# Patient Record
Sex: Female | Born: 1950 | Race: Black or African American | Hispanic: No | Smoking: Current every day smoker
Health system: Southern US, Community
[De-identification: ages and names within clinical notes are randomized; demographics above are authoritative.]

## PROBLEM LIST (undated history)

## (undated) DIAGNOSIS — M1990 Unspecified osteoarthritis, unspecified site: Secondary | ICD-10-CM

## (undated) DIAGNOSIS — H269 Unspecified cataract: Secondary | ICD-10-CM

## (undated) DIAGNOSIS — R011 Cardiac murmur, unspecified: Secondary | ICD-10-CM

## (undated) DIAGNOSIS — T7840XA Allergy, unspecified, initial encounter: Secondary | ICD-10-CM

## (undated) DIAGNOSIS — H409 Unspecified glaucoma: Secondary | ICD-10-CM

## (undated) DIAGNOSIS — F419 Anxiety disorder, unspecified: Secondary | ICD-10-CM

## (undated) DIAGNOSIS — I10 Essential (primary) hypertension: Secondary | ICD-10-CM

## (undated) HISTORY — DX: Unspecified osteoarthritis, unspecified site: M19.90

## (undated) HISTORY — DX: Unspecified cataract: H26.9

## (undated) HISTORY — PX: POLYPECTOMY: SHX149

## (undated) HISTORY — DX: Essential (primary) hypertension: I10

## (undated) HISTORY — DX: Cardiac murmur, unspecified: R01.1

## (undated) HISTORY — DX: Anxiety disorder, unspecified: F41.9

## (undated) HISTORY — DX: Unspecified glaucoma: H40.9

## (undated) HISTORY — DX: Allergy, unspecified, initial encounter: T78.40XA

## (undated) HISTORY — PX: OTHER SURGICAL HISTORY: SHX169

## (undated) HISTORY — PX: COLONOSCOPY: SHX174

---

## 1997-08-02 ENCOUNTER — Encounter: Admission: RE | Admit: 1997-08-02 | Discharge: 1997-08-02 | Payer: Self-pay | Admitting: Family Medicine

## 1998-09-05 ENCOUNTER — Other Ambulatory Visit: Admission: RE | Admit: 1998-09-05 | Discharge: 1998-09-05 | Payer: Self-pay | Admitting: Obstetrics and Gynecology

## 1999-09-03 ENCOUNTER — Encounter: Admission: RE | Admit: 1999-09-03 | Discharge: 1999-09-03 | Payer: Self-pay | Admitting: Obstetrics and Gynecology

## 1999-09-03 ENCOUNTER — Encounter: Payer: Self-pay | Admitting: Obstetrics and Gynecology

## 2000-02-04 ENCOUNTER — Other Ambulatory Visit: Admission: RE | Admit: 2000-02-04 | Discharge: 2000-02-04 | Payer: Self-pay | Admitting: Obstetrics and Gynecology

## 2000-09-07 ENCOUNTER — Encounter: Payer: Self-pay | Admitting: Obstetrics and Gynecology

## 2000-09-07 ENCOUNTER — Encounter: Admission: RE | Admit: 2000-09-07 | Discharge: 2000-09-07 | Payer: Self-pay | Admitting: Obstetrics and Gynecology

## 2001-09-05 ENCOUNTER — Encounter: Payer: Self-pay | Admitting: Emergency Medicine

## 2001-09-05 ENCOUNTER — Emergency Department (HOSPITAL_COMMUNITY): Admission: EM | Admit: 2001-09-05 | Discharge: 2001-09-05 | Payer: Self-pay | Admitting: Emergency Medicine

## 2001-09-10 ENCOUNTER — Encounter: Admission: RE | Admit: 2001-09-10 | Discharge: 2001-09-10 | Payer: Self-pay | Admitting: Obstetrics and Gynecology

## 2001-09-10 ENCOUNTER — Encounter: Payer: Self-pay | Admitting: Obstetrics and Gynecology

## 2002-04-19 ENCOUNTER — Other Ambulatory Visit: Admission: RE | Admit: 2002-04-19 | Discharge: 2002-04-19 | Payer: Self-pay | Admitting: Obstetrics & Gynecology

## 2002-12-21 ENCOUNTER — Encounter: Admission: RE | Admit: 2002-12-21 | Discharge: 2002-12-21 | Payer: Self-pay | Admitting: Obstetrics and Gynecology

## 2002-12-21 ENCOUNTER — Encounter: Payer: Self-pay | Admitting: Obstetrics and Gynecology

## 2003-04-24 ENCOUNTER — Other Ambulatory Visit: Admission: RE | Admit: 2003-04-24 | Discharge: 2003-04-24 | Payer: Self-pay | Admitting: Obstetrics and Gynecology

## 2004-01-24 ENCOUNTER — Encounter: Admission: RE | Admit: 2004-01-24 | Discharge: 2004-01-24 | Payer: Self-pay | Admitting: Obstetrics and Gynecology

## 2005-02-17 ENCOUNTER — Encounter: Admission: RE | Admit: 2005-02-17 | Discharge: 2005-02-17 | Payer: Self-pay | Admitting: Obstetrics and Gynecology

## 2006-02-20 ENCOUNTER — Encounter: Admission: RE | Admit: 2006-02-20 | Discharge: 2006-02-20 | Payer: Self-pay | Admitting: Obstetrics and Gynecology

## 2007-02-24 ENCOUNTER — Encounter: Admission: RE | Admit: 2007-02-24 | Discharge: 2007-02-24 | Payer: Self-pay | Admitting: Internal Medicine

## 2007-07-26 ENCOUNTER — Ambulatory Visit (HOSPITAL_COMMUNITY): Admission: RE | Admit: 2007-07-26 | Discharge: 2007-07-26 | Payer: Self-pay | Admitting: Urology

## 2007-08-24 ENCOUNTER — Encounter (INDEPENDENT_AMBULATORY_CARE_PROVIDER_SITE_OTHER): Payer: Self-pay | Admitting: Urology

## 2007-08-24 ENCOUNTER — Ambulatory Visit (HOSPITAL_BASED_OUTPATIENT_CLINIC_OR_DEPARTMENT_OTHER): Admission: RE | Admit: 2007-08-24 | Discharge: 2007-08-25 | Payer: Self-pay | Admitting: Urology

## 2008-02-23 ENCOUNTER — Encounter: Admission: RE | Admit: 2008-02-23 | Discharge: 2008-02-23 | Payer: Self-pay | Admitting: Internal Medicine

## 2008-04-14 HISTORY — PX: OTHER SURGICAL HISTORY: SHX169

## 2009-02-22 ENCOUNTER — Encounter: Admission: RE | Admit: 2009-02-22 | Discharge: 2009-02-22 | Payer: Self-pay | Admitting: Internal Medicine

## 2010-03-05 ENCOUNTER — Encounter: Admission: RE | Admit: 2010-03-05 | Discharge: 2010-03-05 | Payer: Self-pay | Admitting: Obstetrics and Gynecology

## 2010-05-04 ENCOUNTER — Encounter: Payer: Self-pay | Admitting: Obstetrics and Gynecology

## 2010-07-10 ENCOUNTER — Other Ambulatory Visit: Payer: Self-pay | Admitting: Orthopedic Surgery

## 2010-07-10 DIAGNOSIS — M79644 Pain in right finger(s): Secondary | ICD-10-CM

## 2010-07-11 ENCOUNTER — Ambulatory Visit
Admission: RE | Admit: 2010-07-11 | Discharge: 2010-07-11 | Disposition: A | Discharge status: Home / Self Care | Payer: 59 | Source: Ambulatory Visit | Attending: Orthopedic Surgery | Admitting: Orthopedic Surgery

## 2010-07-11 DIAGNOSIS — M79644 Pain in right finger(s): Secondary | ICD-10-CM

## 2010-07-11 MED ORDER — GADOBENATE DIMEGLUMINE 529 MG/ML IV SOLN
6.0000 mL | Freq: Once | INTRAVENOUS | Status: AC | PRN
  Administered 2010-07-11: 6 mL via INTRAVENOUS

## 2010-08-27 NOTE — Op Note (Signed)
NAME:  Maria Andrews, Maria Andrews                 ACCOUNT NO.:  [id number]   MEDICAL RECORD NO.:  [id number]          PATIENT TYPE:  AMB   LOCATION:  [facility]                         FACILITY:  [facility]   PHYSICIAN:  Lindaann Slough, M.D.  DATE OF BIRTH:  Dec 05, 1950   DATE OF PROCEDURE:  08/24/2007  DATE OF DISCHARGE:                               OPERATIVE REPORT   REFERRING PHYSICIAN:  Maxie Better, M.D.   PREOPERATIVE DIAGNOSIS:  Urethral diverticulum.   POSTOPERATIVE DIAGNOSIS:  Urethral diverticulum.   PROCEDURE:  Urethral diverticulectomy.   SURGEON:  Danae Chen, M.D.   ANESTHESIA:  General.   INDICATIONS:  The patient is a 60 year old female who was found on  physical examination by Dr. Cherly Hensen to have a malodorous drainage from  the urethra.  VCUG showed a urethral diverticulum.  Cystoscopy showed an  ostium on the floor of the urethra to the right side of the midline.  The patient is now admitted for urethral diverticulectomy.  The  procedure, risk and benefits were discussed with the patient.  The risks  include, but are not limited to, hemorrhage, infection, injury to  adjacent organs with subsequent fistula.  She understands and is  agreeable.   DESCRIPTION OF PROCEDURE:  Under general anesthesia, the patient was  prepped and draped and placed in the dorsal lithotomy position.  The  labia majora were approximated to the inner thigh with #2-0 silk.  The  vaginal mucosa was then infiltrated with 1% lidocaine with epinephrine.  Then the vaginal speculum was placed in the vagina.  A panendoscope was  then inserted in the bladder.  The bladder mucosa is normal.  There is  no stone or tumor in the bladder.  The ureteral orifices are in normal  position and shape with clear efflux.  There is a diverticulum on the  right side of the urethra and the ostium of the diverticulum is on the  floor of the urethra to the right of the midline.  The cystoscope was  then removed.  A #18 Foley  catheter was then inserted in the bladder.  A  longitudinal incision was made on the vaginal mucosa in the midline at  about 2 cm from the urethral meatus.  The incision was carried down to  the subcutaneous tissues.  Then a transverse incision was made in the  periurethral fascia and the diverticulum was identified and dissected  from the periurethral tissues.  Then the diverticulum was excised.  The  ostium of the diverticulum was identified and then closed in two layers  in a longitudinal fashion with a #3-0 Vicryl.  The bladder was then  filled with normal saline and there was no evidence of leak from the  bladder.  On pulling the Foley catheter in the urethra, the closure was  watertight.  The Foley catheter was then replaced in the bladder.  The  wound was then irrigated with bug juice.  The Foley catheter was then  removed.  The cystoscope was then reinserted in the bladder.  There was  no evidence of injury to the bladder and  the ostium of the diverticulum  was closed.  The urethra is normal.  Then the subcutaneous tissues were  closed in a transverse fashion with #3-0 Vicryl and the vaginal mucosa  was closed in a longitudinal fashion with #2-0 Vicryl.  Please note that  a urethral dilation was done at the beginning of the procedure since the  cystoscope could not be passed in the bladder without difficulty.  The  meatus was dilated up to #30-French.  Then a vaginal packing with  Estrace cream was then placed in the vagina.  The 2-0 silk sutures were  then removed.  The vaginal speculum was removed.  The Foley catheter was  then reinserted in the bladder and left to straight drainage.   ESTIMATED BLOOD LOSS:  Minimal.   The patient tolerated the procedure well and left the OR in satisfactory  condition to post anesthesia care unit.      Lindaann Slough, M.D.  Electronically Signed     MN/MEDQ  D:  08/24/2007  T:  08/24/2007  Job:  [id number]

## 2010-10-09 ENCOUNTER — Other Ambulatory Visit: Payer: Self-pay | Admitting: Obstetrics and Gynecology

## 2011-01-28 ENCOUNTER — Other Ambulatory Visit: Payer: Self-pay | Admitting: Obstetrics and Gynecology

## 2011-01-28 DIAGNOSIS — Z1231 Encounter for screening mammogram for malignant neoplasm of breast: Secondary | ICD-10-CM

## 2011-03-07 ENCOUNTER — Ambulatory Visit
Admission: RE | Admit: 2011-03-07 | Discharge: 2011-03-07 | Disposition: A | Discharge status: Home / Self Care | Payer: 59 | Source: Ambulatory Visit | Attending: Obstetrics and Gynecology | Admitting: Obstetrics and Gynecology

## 2011-03-07 DIAGNOSIS — Z1231 Encounter for screening mammogram for malignant neoplasm of breast: Secondary | ICD-10-CM

## 2012-01-27 ENCOUNTER — Other Ambulatory Visit: Payer: Self-pay | Admitting: Obstetrics and Gynecology

## 2012-01-27 DIAGNOSIS — Z1231 Encounter for screening mammogram for malignant neoplasm of breast: Secondary | ICD-10-CM

## 2012-03-08 ENCOUNTER — Ambulatory Visit
Admission: RE | Admit: 2012-03-08 | Discharge: 2012-03-08 | Disposition: A | Discharge status: Home / Self Care | Payer: 59 | Source: Ambulatory Visit | Attending: Obstetrics and Gynecology | Admitting: Obstetrics and Gynecology

## 2012-03-08 DIAGNOSIS — Z1231 Encounter for screening mammogram for malignant neoplasm of breast: Secondary | ICD-10-CM

## 2012-03-12 ENCOUNTER — Ambulatory Visit: Payer: 59

## 2012-04-14 HISTORY — PX: FOOT SURGERY: SHX648

## 2013-02-04 ENCOUNTER — Other Ambulatory Visit: Payer: Self-pay

## 2013-02-04 DIAGNOSIS — Z1231 Encounter for screening mammogram for malignant neoplasm of breast: Secondary | ICD-10-CM

## 2013-02-10 ENCOUNTER — Ambulatory Visit: Payer: Self-pay | Admitting: Podiatry

## 2013-02-21 ENCOUNTER — Ambulatory Visit (INDEPENDENT_AMBULATORY_CARE_PROVIDER_SITE_OTHER): Payer: 59

## 2013-02-21 ENCOUNTER — Encounter: Payer: Self-pay | Admitting: Podiatry

## 2013-02-21 ENCOUNTER — Ambulatory Visit (INDEPENDENT_AMBULATORY_CARE_PROVIDER_SITE_OTHER): Payer: 59 | Admitting: Podiatry

## 2013-02-21 VITALS — BP 176/93 | HR 77 | Resp 16

## 2013-02-21 DIAGNOSIS — M79609 Pain in unspecified limb: Secondary | ICD-10-CM

## 2013-02-21 DIAGNOSIS — M79672 Pain in left foot: Secondary | ICD-10-CM

## 2013-02-21 DIAGNOSIS — M779 Enthesopathy, unspecified: Secondary | ICD-10-CM

## 2013-02-21 DIAGNOSIS — M2010 Hallux valgus (acquired), unspecified foot: Secondary | ICD-10-CM

## 2013-02-21 MED ORDER — TRIAMCINOLONE ACETONIDE 10 MG/ML IJ SUSP
5.0000 mg | Freq: Once | INTRAMUSCULAR | Status: AC
  Administered 2013-02-21: 5 mg via INTRA_ARTICULAR

## 2013-02-21 NOTE — Progress Notes (Signed)
Subjective:     Patient ID: Maria Andrews, female   DOB: Aug 23, 1950, 62 y.o.   MRN: [id number]  Foot Pain   patient states that my left foot is hurting in the ball and that both feet hurt when I get up in the morning. States this is been going on for several months   Review of Systems  All other systems reviewed and are negative.       Objective:   Physical Exam  Constitutional: She is oriented to person, place, and time.  Cardiovascular: Intact distal pulses.   Musculoskeletal: Normal range of motion.  Neurological: She is oriented to person, place, and time.  Skin: Skin is warm.   patient's left second MPJ is sore at the current time with mild discomfort underneath the adjacent metatarsal    Assessment:     Probable capsulitis second MPJ left    Plan:     X-ray taken and discussed and discussed working on this condition. Did a proximal nerve block I then aspirated the joint was able to get a small amount of fluid and injected with a half cc of dexamethasone Kenalog combination and applied thick plantar pad reappoint her recheck in several weeks

## 2013-02-21 NOTE — Patient Instructions (Signed)
Wear the pad during the day and take off when sitting, showering or sleeping

## 2013-03-09 ENCOUNTER — Ambulatory Visit: Payer: 59

## 2013-03-14 ENCOUNTER — Ambulatory Visit: Payer: 59 | Admitting: Podiatry

## 2013-04-21 ENCOUNTER — Ambulatory Visit
Admission: RE | Admit: 2013-04-21 | Discharge: 2013-04-21 | Disposition: A | Discharge status: Home / Self Care | Payer: 59 | Source: Ambulatory Visit

## 2013-04-21 DIAGNOSIS — Z1231 Encounter for screening mammogram for malignant neoplasm of breast: Secondary | ICD-10-CM

## 2013-09-12 ENCOUNTER — Encounter: Payer: Self-pay | Admitting: Internal Medicine

## 2013-09-19 ENCOUNTER — Encounter: Payer: Self-pay | Admitting: Internal Medicine

## 2013-11-25 ENCOUNTER — Ambulatory Visit (AMBULATORY_SURGERY_CENTER): Payer: Self-pay

## 2013-11-25 VITALS — Ht 69.0 in | Wt 165.2 lb

## 2013-11-25 DIAGNOSIS — Z1211 Encounter for screening for malignant neoplasm of colon: Secondary | ICD-10-CM

## 2013-11-25 MED ORDER — MOVIPREP 100 G PO SOLR: ORAL | Status: DC

## 2013-12-09 ENCOUNTER — Other Ambulatory Visit: Payer: Self-pay | Admitting: Internal Medicine

## 2013-12-09 ENCOUNTER — Encounter: Payer: Self-pay | Admitting: Internal Medicine

## 2013-12-09 ENCOUNTER — Ambulatory Visit (AMBULATORY_SURGERY_CENTER): Payer: 59 | Admitting: Internal Medicine

## 2013-12-09 VITALS — BP 166/92 | HR 64 | Temp 98.5°F | Resp 11 | Ht 69.0 in | Wt 165.0 lb

## 2013-12-09 DIAGNOSIS — Z1211 Encounter for screening for malignant neoplasm of colon: Secondary | ICD-10-CM

## 2013-12-09 DIAGNOSIS — D122 Benign neoplasm of ascending colon: Secondary | ICD-10-CM

## 2013-12-09 DIAGNOSIS — D126 Benign neoplasm of colon, unspecified: Secondary | ICD-10-CM

## 2013-12-09 MED ORDER — SODIUM CHLORIDE 0.9 % IV SOLN: 500.0000 mL | INTRAVENOUS | Status: DC

## 2013-12-09 NOTE — Progress Notes (Signed)
Called to room to assist during endoscopic procedure.  Patient ID and intended procedure confirmed with present staff. Received instructions for my participation in the procedure from the performing physician.  

## 2013-12-09 NOTE — Op Note (Signed)
[place]  [address] [place], [address]   COLONOSCOPY PROCEDURE REPORT  PATIENT: Aniylah, [name]  MR#: [id number] BIRTHDATE: 1951/03/08 , 62  yrs. old GENDER: Female ENDOSCOPIST: Lafayette Dragon, MD REFERRED PR:[facility], M.D. PROCEDURE DATE:  12/09/2013 PROCEDURE:   Colonoscopy with cold biopsy polypectomy First Screening Colonoscopy - Avg.  risk and is 50 yrs.  old or older - No.  Prior Negative Screening - Now for repeat screening. 10 or more years since last screening  History of Adenoma - Now for follow-up colonoscopy & has been > or = to 3 yrs.  N/A  Polyps Removed Today? Yes. ASA CLASS:   Class I INDICATIONS:average risk for colon cancer.  The last exam in June 2005 was normal. MEDICATIONS: MAC sedation, administered by CRNA and propofol (Diprivan) 350mg  IV  DESCRIPTION OF PROCEDURE:   After the risks benefits and alternatives of the procedure were thoroughly explained, informed consent was obtained.  A digital rectal exam revealed no abnormalities of the rectum.   The LB [id number]-H190 [id number]  endoscope was introduced through the anus and advanced to the cecum, which was identified by both the appendix and ileocecal valve. No adverse events experienced.   The quality of the prep was good, using MoviPrep  The instrument was then slowly withdrawn as the colon was fully examined.      COLON FINDINGS: A diminutive sessile polyp was found in the ascending colon.  A polypectomy was performed with cold forceps. The resection was complete and the polyp tissue was completely retrieved.  Retroflexed views revealed no abnormalities. The time to cecum=5 minutes 30 seconds.  Withdrawal time=6 minutes 22 seconds.  The scope was withdrawn and the procedure completed. COMPLICATIONS: There were no complications.  ENDOSCOPIC IMPRESSION: Diminutive sessile polyp was found in the ascending colon; polypectomy was performed with cold  forceps  RECOMMENDATIONS: 1.  Await biopsy results 2.  high fiber diet Recall colonoscopy pending path report   eSigned:  Lafayette Dragon, MD 12/09/2013 10:13 AM   cc:   PATIENT NAME:  Maria, Andrews MR#: [id number]

## 2013-12-09 NOTE — Patient Instructions (Signed)
YOU HAD AN ENDOSCOPIC PROCEDURE TODAY AT THE [facility]: Refer to the procedure report that was given to you for any specific questions about what was found during the examination.  If the procedure report does not answer your questions, please call your gastroenterologist to clarify.  If you requested that your care partner not be given the details of your procedure findings, then the procedure report has been included in a sealed envelope for you to review at your convenience later.  YOU SHOULD EXPECT: Some feelings of bloating in the abdomen. Passage of more gas than usual.  Walking can help get rid of the air that was put into your GI tract during the procedure and reduce the bloating. If you had a lower endoscopy (such as a colonoscopy or flexible sigmoidoscopy) you may notice spotting of blood in your stool or on the toilet paper. If you underwent a bowel prep for your procedure, then you may not have a normal bowel movement for a few days.  DIET: Your first meal following the procedure should be a light meal and then it is ok to progress to your normal diet.  A half-sandwich or bowl of soup is an example of a good first meal.  Heavy or fried foods are harder to digest and may make you feel nauseous or bloated.  Likewise meals heavy in dairy and vegetables can cause extra gas to form and this can also increase the bloating.  Drink plenty of fluids but you should avoid alcoholic beverages for 24 hours.  ACTIVITY: Your care partner should take you home directly after the procedure.  You should plan to take it easy, moving slowly for the rest of the day.  You can resume normal activity the day after the procedure however you should NOT DRIVE or use heavy machinery for 24 hours (because of the sedation medicines used during the test).    SYMPTOMS TO REPORT IMMEDIATELY: A gastroenterologist can be reached at any hour.  During normal business hours, 8:30 AM to 5:00 PM Monday through Friday,  call [phone].  After hours and on weekends, please call the GI answering service at [phone] who will take a message and have the physician on call contact you.   Following lower endoscopy (colonoscopy or flexible sigmoidoscopy):  Excessive amounts of blood in the stool  Significant tenderness or worsening of abdominal pains  Swelling of the abdomen that is new, acute  Fever of 100F or higher    FOLLOW UP: If any biopsies were taken you will be contacted by phone or by letter within the next 1-3 weeks.  Call your gastroenterologist if you have not heard about the biopsies in 3 weeks.  Our staff will call the home number listed on your records the next business day following your procedure to check on you and address any questions or concerns that you may have at that time regarding the information given to you following your procedure. This is a courtesy call and so if there is no answer at the home number and we have not heard from you through the emergency physician on call, we will assume that you have returned to your regular daily activities without incident.  SIGNATURES/CONFIDENTIALITY: You and/or your care partner have signed paperwork which will be entered into your electronic medical record.  These signatures attest to the fact that that the information above on your After Visit Summary has been reviewed and is understood.  Full responsibility of the confidentiality   of this discharge information lies with you and/or your care-partner.   INFORMATION ON POLYPS AND HIGH FIBER DIET GIVEN TO YOU TODAY

## 2013-12-09 NOTE — Progress Notes (Signed)
Procedure ends, to recovery, report given and VSS. 

## 2013-12-12 ENCOUNTER — Telehealth: Payer: Self-pay

## 2013-12-12 NOTE — Telephone Encounter (Signed)
Left message on answering machine. 

## 2013-12-13 ENCOUNTER — Encounter: Payer: Self-pay | Admitting: Internal Medicine

## 2013-12-14 ENCOUNTER — Encounter: Payer: Self-pay | Admitting: *Deleted

## 2014-03-21 ENCOUNTER — Other Ambulatory Visit: Payer: Self-pay

## 2014-03-21 DIAGNOSIS — Z1231 Encounter for screening mammogram for malignant neoplasm of breast: Secondary | ICD-10-CM

## 2014-04-14 HISTORY — PX: KNEE ARTHROSCOPY: SHX127

## 2014-04-28 ENCOUNTER — Encounter (INDEPENDENT_AMBULATORY_CARE_PROVIDER_SITE_OTHER): Payer: Self-pay

## 2014-04-28 ENCOUNTER — Ambulatory Visit
Admission: RE | Admit: 2014-04-28 | Discharge: 2014-04-28 | Disposition: A | Discharge status: Home / Self Care | Payer: 59 | Source: Ambulatory Visit

## 2014-04-28 DIAGNOSIS — Z1231 Encounter for screening mammogram for malignant neoplasm of breast: Secondary | ICD-10-CM

## 2015-04-11 ENCOUNTER — Other Ambulatory Visit: Payer: Self-pay

## 2015-04-11 DIAGNOSIS — Z1231 Encounter for screening mammogram for malignant neoplasm of breast: Secondary | ICD-10-CM

## 2015-05-08 ENCOUNTER — Ambulatory Visit
Admission: RE | Admit: 2015-05-08 | Discharge: 2015-05-08 | Disposition: A | Discharge status: Home / Self Care | Payer: BLUE CROSS/BLUE SHIELD | Source: Ambulatory Visit

## 2015-05-08 DIAGNOSIS — Z1231 Encounter for screening mammogram for malignant neoplasm of breast: Secondary | ICD-10-CM

## 2016-03-31 ENCOUNTER — Other Ambulatory Visit: Payer: Self-pay | Admitting: Obstetrics and Gynecology

## 2016-03-31 DIAGNOSIS — Z1231 Encounter for screening mammogram for malignant neoplasm of breast: Secondary | ICD-10-CM

## 2016-05-09 ENCOUNTER — Ambulatory Visit
Admission: RE | Admit: 2016-05-09 | Discharge: 2016-05-09 | Disposition: A | Discharge status: Home / Self Care | Payer: BLUE CROSS/BLUE SHIELD | Source: Ambulatory Visit | Attending: Obstetrics and Gynecology | Admitting: Obstetrics and Gynecology

## 2016-05-09 DIAGNOSIS — Z1231 Encounter for screening mammogram for malignant neoplasm of breast: Secondary | ICD-10-CM

## 2017-03-30 ENCOUNTER — Other Ambulatory Visit: Payer: Self-pay | Admitting: Obstetrics and Gynecology

## 2017-03-30 DIAGNOSIS — Z139 Encounter for screening, unspecified: Secondary | ICD-10-CM

## 2017-05-11 ENCOUNTER — Ambulatory Visit
Admission: RE | Admit: 2017-05-11 | Discharge: 2017-05-11 | Disposition: A | Discharge status: Home / Self Care | Payer: BLUE CROSS/BLUE SHIELD | Source: Ambulatory Visit | Attending: Obstetrics and Gynecology | Admitting: Obstetrics and Gynecology

## 2017-05-11 DIAGNOSIS — Z139 Encounter for screening, unspecified: Secondary | ICD-10-CM

## 2018-03-15 DIAGNOSIS — I10 Essential (primary) hypertension: Secondary | ICD-10-CM | POA: Diagnosis not present

## 2018-03-15 DIAGNOSIS — M1711 Unilateral primary osteoarthritis, right knee: Secondary | ICD-10-CM | POA: Diagnosis not present

## 2018-03-15 DIAGNOSIS — Z79899 Other long term (current) drug therapy: Secondary | ICD-10-CM | POA: Diagnosis not present

## 2018-03-15 DIAGNOSIS — Z0000 Encounter for general adult medical examination without abnormal findings: Secondary | ICD-10-CM | POA: Diagnosis not present

## 2018-03-30 DIAGNOSIS — K529 Noninfective gastroenteritis and colitis, unspecified: Secondary | ICD-10-CM | POA: Diagnosis not present

## 2018-03-30 DIAGNOSIS — Z0000 Encounter for general adult medical examination without abnormal findings: Secondary | ICD-10-CM | POA: Diagnosis not present

## 2018-03-30 DIAGNOSIS — I10 Essential (primary) hypertension: Secondary | ICD-10-CM | POA: Diagnosis not present

## 2018-04-12 ENCOUNTER — Other Ambulatory Visit: Payer: Self-pay | Admitting: Obstetrics and Gynecology

## 2018-04-12 DIAGNOSIS — Z1231 Encounter for screening mammogram for malignant neoplasm of breast: Secondary | ICD-10-CM

## 2018-04-26 DIAGNOSIS — M1711 Unilateral primary osteoarthritis, right knee: Secondary | ICD-10-CM | POA: Diagnosis not present

## 2018-05-03 DIAGNOSIS — M21061 Valgus deformity, not elsewhere classified, right knee: Secondary | ICD-10-CM | POA: Diagnosis not present

## 2018-05-03 DIAGNOSIS — M7061 Trochanteric bursitis, right hip: Secondary | ICD-10-CM | POA: Diagnosis not present

## 2018-05-03 DIAGNOSIS — R531 Weakness: Secondary | ICD-10-CM | POA: Diagnosis not present

## 2018-05-10 DIAGNOSIS — M7061 Trochanteric bursitis, right hip: Secondary | ICD-10-CM | POA: Diagnosis not present

## 2018-05-10 DIAGNOSIS — R531 Weakness: Secondary | ICD-10-CM | POA: Diagnosis not present

## 2018-05-10 DIAGNOSIS — M21061 Valgus deformity, not elsewhere classified, right knee: Secondary | ICD-10-CM | POA: Diagnosis not present

## 2018-05-13 ENCOUNTER — Ambulatory Visit
Admission: RE | Admit: 2018-05-13 | Discharge: 2018-05-13 | Disposition: A | Discharge status: Home / Self Care | Payer: Medicare Other | Source: Ambulatory Visit | Attending: Obstetrics and Gynecology | Admitting: Obstetrics and Gynecology

## 2018-05-13 DIAGNOSIS — Z1231 Encounter for screening mammogram for malignant neoplasm of breast: Secondary | ICD-10-CM | POA: Diagnosis not present

## 2018-08-04 ENCOUNTER — Other Ambulatory Visit: Payer: Self-pay | Admitting: Obstetrics and Gynecology

## 2018-08-04 DIAGNOSIS — R5381 Other malaise: Secondary | ICD-10-CM

## 2018-11-04 ENCOUNTER — Encounter: Payer: Self-pay | Admitting: Gastroenterology

## 2018-11-23 DIAGNOSIS — H401131 Primary open-angle glaucoma, bilateral, mild stage: Secondary | ICD-10-CM | POA: Diagnosis not present

## 2018-11-23 DIAGNOSIS — H52223 Regular astigmatism, bilateral: Secondary | ICD-10-CM | POA: Diagnosis not present

## 2018-12-23 DIAGNOSIS — H401132 Primary open-angle glaucoma, bilateral, moderate stage: Secondary | ICD-10-CM | POA: Diagnosis not present

## 2019-04-05 ENCOUNTER — Other Ambulatory Visit: Payer: Self-pay | Admitting: Obstetrics and Gynecology

## 2019-04-05 DIAGNOSIS — Z1231 Encounter for screening mammogram for malignant neoplasm of breast: Secondary | ICD-10-CM

## 2019-04-26 DIAGNOSIS — Z0000 Encounter for general adult medical examination without abnormal findings: Secondary | ICD-10-CM | POA: Diagnosis not present

## 2019-04-26 DIAGNOSIS — Z23 Encounter for immunization: Secondary | ICD-10-CM | POA: Diagnosis not present

## 2019-04-26 DIAGNOSIS — R5383 Other fatigue: Secondary | ICD-10-CM | POA: Diagnosis not present

## 2019-04-26 DIAGNOSIS — M25561 Pain in right knee: Secondary | ICD-10-CM | POA: Diagnosis not present

## 2019-04-26 DIAGNOSIS — I10 Essential (primary) hypertension: Secondary | ICD-10-CM | POA: Diagnosis not present

## 2019-04-26 DIAGNOSIS — Z780 Asymptomatic menopausal state: Secondary | ICD-10-CM | POA: Diagnosis not present

## 2019-04-26 DIAGNOSIS — M1711 Unilateral primary osteoarthritis, right knee: Secondary | ICD-10-CM | POA: Diagnosis not present

## 2019-05-03 DIAGNOSIS — Z0000 Encounter for general adult medical examination without abnormal findings: Secondary | ICD-10-CM | POA: Diagnosis not present

## 2019-05-03 DIAGNOSIS — I10 Essential (primary) hypertension: Secondary | ICD-10-CM | POA: Diagnosis not present

## 2019-05-03 DIAGNOSIS — M1711 Unilateral primary osteoarthritis, right knee: Secondary | ICD-10-CM | POA: Diagnosis not present

## 2019-05-03 DIAGNOSIS — Z780 Asymptomatic menopausal state: Secondary | ICD-10-CM | POA: Diagnosis not present

## 2019-05-03 DIAGNOSIS — F17200 Nicotine dependence, unspecified, uncomplicated: Secondary | ICD-10-CM | POA: Diagnosis not present

## 2019-05-03 DIAGNOSIS — M25561 Pain in right knee: Secondary | ICD-10-CM | POA: Diagnosis not present

## 2019-05-11 DIAGNOSIS — M21061 Valgus deformity, not elsewhere classified, right knee: Secondary | ICD-10-CM | POA: Diagnosis not present

## 2019-05-11 DIAGNOSIS — M1711 Unilateral primary osteoarthritis, right knee: Secondary | ICD-10-CM | POA: Diagnosis not present

## 2019-05-23 ENCOUNTER — Ambulatory Visit: Payer: Medicare HMO

## 2019-05-23 ENCOUNTER — Other Ambulatory Visit: Payer: Self-pay

## 2019-05-23 ENCOUNTER — Ambulatory Visit
Admission: RE | Admit: 2019-05-23 | Discharge: 2019-05-23 | Disposition: A | Discharge status: Home / Self Care | Payer: Medicare Other | Source: Ambulatory Visit | Attending: Obstetrics and Gynecology | Admitting: Obstetrics and Gynecology

## 2019-05-23 DIAGNOSIS — Z1231 Encounter for screening mammogram for malignant neoplasm of breast: Secondary | ICD-10-CM | POA: Diagnosis not present

## 2019-06-30 DIAGNOSIS — H401131 Primary open-angle glaucoma, bilateral, mild stage: Secondary | ICD-10-CM | POA: Diagnosis not present

## 2019-08-08 DIAGNOSIS — Z124 Encounter for screening for malignant neoplasm of cervix: Secondary | ICD-10-CM | POA: Diagnosis not present

## 2019-08-08 DIAGNOSIS — Z01419 Encounter for gynecological examination (general) (routine) without abnormal findings: Secondary | ICD-10-CM | POA: Diagnosis not present

## 2019-08-17 DIAGNOSIS — M21061 Valgus deformity, not elsewhere classified, right knee: Secondary | ICD-10-CM | POA: Diagnosis not present

## 2019-08-17 DIAGNOSIS — M1711 Unilateral primary osteoarthritis, right knee: Secondary | ICD-10-CM | POA: Diagnosis not present

## 2019-09-14 DIAGNOSIS — M1711 Unilateral primary osteoarthritis, right knee: Secondary | ICD-10-CM | POA: Diagnosis not present

## 2019-09-21 DIAGNOSIS — M1711 Unilateral primary osteoarthritis, right knee: Secondary | ICD-10-CM | POA: Diagnosis not present

## 2019-09-28 DIAGNOSIS — M1711 Unilateral primary osteoarthritis, right knee: Secondary | ICD-10-CM | POA: Diagnosis not present

## 2019-11-16 DIAGNOSIS — M7061 Trochanteric bursitis, right hip: Secondary | ICD-10-CM | POA: Diagnosis not present

## 2019-11-16 DIAGNOSIS — M21061 Valgus deformity, not elsewhere classified, right knee: Secondary | ICD-10-CM | POA: Diagnosis not present

## 2019-12-15 DIAGNOSIS — H524 Presbyopia: Secondary | ICD-10-CM | POA: Diagnosis not present

## 2019-12-15 DIAGNOSIS — H5203 Hypermetropia, bilateral: Secondary | ICD-10-CM | POA: Diagnosis not present

## 2019-12-15 DIAGNOSIS — H401131 Primary open-angle glaucoma, bilateral, mild stage: Secondary | ICD-10-CM | POA: Diagnosis not present

## 2019-12-15 DIAGNOSIS — H25813 Combined forms of age-related cataract, bilateral: Secondary | ICD-10-CM | POA: Diagnosis not present

## 2019-12-15 DIAGNOSIS — I10 Essential (primary) hypertension: Secondary | ICD-10-CM | POA: Diagnosis not present

## 2019-12-15 DIAGNOSIS — H52223 Regular astigmatism, bilateral: Secondary | ICD-10-CM | POA: Diagnosis not present

## 2019-12-30 DIAGNOSIS — H2513 Age-related nuclear cataract, bilateral: Secondary | ICD-10-CM | POA: Diagnosis not present

## 2019-12-30 DIAGNOSIS — H401132 Primary open-angle glaucoma, bilateral, moderate stage: Secondary | ICD-10-CM | POA: Diagnosis not present

## 2019-12-30 DIAGNOSIS — H2512 Age-related nuclear cataract, left eye: Secondary | ICD-10-CM | POA: Diagnosis not present

## 2020-01-26 DIAGNOSIS — H52222 Regular astigmatism, left eye: Secondary | ICD-10-CM | POA: Diagnosis not present

## 2020-01-26 DIAGNOSIS — Z961 Presence of intraocular lens: Secondary | ICD-10-CM | POA: Diagnosis not present

## 2020-01-26 DIAGNOSIS — H401122 Primary open-angle glaucoma, left eye, moderate stage: Secondary | ICD-10-CM | POA: Diagnosis not present

## 2020-01-26 DIAGNOSIS — H2512 Age-related nuclear cataract, left eye: Secondary | ICD-10-CM | POA: Diagnosis not present

## 2020-01-27 DIAGNOSIS — H2511 Age-related nuclear cataract, right eye: Secondary | ICD-10-CM | POA: Diagnosis not present

## 2020-02-09 DIAGNOSIS — H401112 Primary open-angle glaucoma, right eye, moderate stage: Secondary | ICD-10-CM | POA: Diagnosis not present

## 2020-02-09 DIAGNOSIS — H524 Presbyopia: Secondary | ICD-10-CM | POA: Diagnosis not present

## 2020-02-09 DIAGNOSIS — H52223 Regular astigmatism, bilateral: Secondary | ICD-10-CM | POA: Diagnosis not present

## 2020-02-09 DIAGNOSIS — H5201 Hypermetropia, right eye: Secondary | ICD-10-CM | POA: Diagnosis not present

## 2020-02-09 DIAGNOSIS — Z961 Presence of intraocular lens: Secondary | ICD-10-CM | POA: Diagnosis not present

## 2020-02-09 DIAGNOSIS — H2511 Age-related nuclear cataract, right eye: Secondary | ICD-10-CM | POA: Diagnosis not present

## 2020-04-20 ENCOUNTER — Other Ambulatory Visit: Payer: Self-pay | Admitting: Obstetrics and Gynecology

## 2020-04-20 DIAGNOSIS — Z1231 Encounter for screening mammogram for malignant neoplasm of breast: Secondary | ICD-10-CM

## 2020-05-10 DIAGNOSIS — F17200 Nicotine dependence, unspecified, uncomplicated: Secondary | ICD-10-CM | POA: Diagnosis not present

## 2020-05-10 DIAGNOSIS — I10 Essential (primary) hypertension: Secondary | ICD-10-CM | POA: Diagnosis not present

## 2020-05-10 DIAGNOSIS — Z0000 Encounter for general adult medical examination without abnormal findings: Secondary | ICD-10-CM | POA: Diagnosis not present

## 2020-05-14 DIAGNOSIS — Z0100 Encounter for examination of eyes and vision without abnormal findings: Secondary | ICD-10-CM | POA: Diagnosis not present

## 2020-05-15 DIAGNOSIS — F17200 Nicotine dependence, unspecified, uncomplicated: Secondary | ICD-10-CM | POA: Diagnosis not present

## 2020-05-15 DIAGNOSIS — I10 Essential (primary) hypertension: Secondary | ICD-10-CM | POA: Diagnosis not present

## 2020-05-15 DIAGNOSIS — Z0000 Encounter for general adult medical examination without abnormal findings: Secondary | ICD-10-CM | POA: Diagnosis not present

## 2020-05-30 DIAGNOSIS — M21061 Valgus deformity, not elsewhere classified, right knee: Secondary | ICD-10-CM | POA: Diagnosis not present

## 2020-05-30 DIAGNOSIS — M545 Low back pain, unspecified: Secondary | ICD-10-CM | POA: Diagnosis not present

## 2020-05-30 DIAGNOSIS — M1711 Unilateral primary osteoarthritis, right knee: Secondary | ICD-10-CM | POA: Diagnosis not present

## 2020-05-31 ENCOUNTER — Other Ambulatory Visit: Payer: Self-pay

## 2020-05-31 ENCOUNTER — Ambulatory Visit
Admission: RE | Admit: 2020-05-31 | Discharge: 2020-05-31 | Disposition: A | Discharge status: Home / Self Care | Payer: Medicare HMO | Source: Ambulatory Visit | Attending: Obstetrics and Gynecology | Admitting: Obstetrics and Gynecology

## 2020-05-31 DIAGNOSIS — Z1231 Encounter for screening mammogram for malignant neoplasm of breast: Secondary | ICD-10-CM | POA: Diagnosis not present

## 2020-06-13 DIAGNOSIS — S39012A Strain of muscle, fascia and tendon of lower back, initial encounter: Secondary | ICD-10-CM | POA: Diagnosis not present

## 2020-06-13 DIAGNOSIS — M9902 Segmental and somatic dysfunction of thoracic region: Secondary | ICD-10-CM | POA: Diagnosis not present

## 2020-06-13 DIAGNOSIS — S338XXA Sprain of other parts of lumbar spine and pelvis, initial encounter: Secondary | ICD-10-CM | POA: Diagnosis not present

## 2020-06-13 DIAGNOSIS — M9905 Segmental and somatic dysfunction of pelvic region: Secondary | ICD-10-CM | POA: Diagnosis not present

## 2020-06-13 DIAGNOSIS — M9903 Segmental and somatic dysfunction of lumbar region: Secondary | ICD-10-CM | POA: Diagnosis not present

## 2020-06-13 DIAGNOSIS — S29012A Strain of muscle and tendon of back wall of thorax, initial encounter: Secondary | ICD-10-CM | POA: Diagnosis not present

## 2020-06-18 DIAGNOSIS — S29012A Strain of muscle and tendon of back wall of thorax, initial encounter: Secondary | ICD-10-CM | POA: Diagnosis not present

## 2020-06-18 DIAGNOSIS — M9902 Segmental and somatic dysfunction of thoracic region: Secondary | ICD-10-CM | POA: Diagnosis not present

## 2020-06-18 DIAGNOSIS — S39012A Strain of muscle, fascia and tendon of lower back, initial encounter: Secondary | ICD-10-CM | POA: Diagnosis not present

## 2020-06-18 DIAGNOSIS — M9905 Segmental and somatic dysfunction of pelvic region: Secondary | ICD-10-CM | POA: Diagnosis not present

## 2020-06-18 DIAGNOSIS — S338XXA Sprain of other parts of lumbar spine and pelvis, initial encounter: Secondary | ICD-10-CM | POA: Diagnosis not present

## 2020-06-18 DIAGNOSIS — M9903 Segmental and somatic dysfunction of lumbar region: Secondary | ICD-10-CM | POA: Diagnosis not present

## 2020-06-27 DIAGNOSIS — S338XXA Sprain of other parts of lumbar spine and pelvis, initial encounter: Secondary | ICD-10-CM | POA: Diagnosis not present

## 2020-06-27 DIAGNOSIS — M9903 Segmental and somatic dysfunction of lumbar region: Secondary | ICD-10-CM | POA: Diagnosis not present

## 2020-06-27 DIAGNOSIS — S39012A Strain of muscle, fascia and tendon of lower back, initial encounter: Secondary | ICD-10-CM | POA: Diagnosis not present

## 2020-06-27 DIAGNOSIS — M9902 Segmental and somatic dysfunction of thoracic region: Secondary | ICD-10-CM | POA: Diagnosis not present

## 2020-06-27 DIAGNOSIS — M9905 Segmental and somatic dysfunction of pelvic region: Secondary | ICD-10-CM | POA: Diagnosis not present

## 2020-06-27 DIAGNOSIS — S29012A Strain of muscle and tendon of back wall of thorax, initial encounter: Secondary | ICD-10-CM | POA: Diagnosis not present

## 2020-07-02 DIAGNOSIS — I10 Essential (primary) hypertension: Secondary | ICD-10-CM | POA: Diagnosis not present

## 2020-10-11 DIAGNOSIS — M1711 Unilateral primary osteoarthritis, right knee: Secondary | ICD-10-CM | POA: Diagnosis not present

## 2020-10-11 DIAGNOSIS — I10 Essential (primary) hypertension: Secondary | ICD-10-CM | POA: Diagnosis not present

## 2020-11-11 DIAGNOSIS — M1711 Unilateral primary osteoarthritis, right knee: Secondary | ICD-10-CM | POA: Diagnosis not present

## 2020-11-11 DIAGNOSIS — I10 Essential (primary) hypertension: Secondary | ICD-10-CM | POA: Diagnosis not present

## 2020-12-05 DIAGNOSIS — N3281 Overactive bladder: Secondary | ICD-10-CM | POA: Diagnosis not present

## 2020-12-05 DIAGNOSIS — I10 Essential (primary) hypertension: Secondary | ICD-10-CM | POA: Diagnosis not present

## 2020-12-05 DIAGNOSIS — Z01419 Encounter for gynecological examination (general) (routine) without abnormal findings: Secondary | ICD-10-CM | POA: Diagnosis not present

## 2020-12-06 ENCOUNTER — Encounter: Payer: Self-pay | Admitting: Gastroenterology

## 2020-12-12 DIAGNOSIS — I10 Essential (primary) hypertension: Secondary | ICD-10-CM | POA: Diagnosis not present

## 2020-12-12 DIAGNOSIS — M1711 Unilateral primary osteoarthritis, right knee: Secondary | ICD-10-CM | POA: Diagnosis not present

## 2021-01-08 ENCOUNTER — Other Ambulatory Visit: Payer: Self-pay

## 2021-01-08 ENCOUNTER — Ambulatory Visit (AMBULATORY_SURGERY_CENTER): Payer: Medicare HMO | Admitting: *Deleted

## 2021-01-08 VITALS — Ht 68.0 in | Wt 170.0 lb

## 2021-01-08 DIAGNOSIS — Z86010 Personal history of colonic polyps: Secondary | ICD-10-CM

## 2021-01-08 MED ORDER — NA SULFATE-K SULFATE-MG SULF 17.5-3.13-1.6 GM/177ML PO SOLN: 1.0000 | Freq: Once | ORAL | 0 refills | Status: AC

## 2021-01-08 NOTE — Progress Notes (Signed)
Pt verified name, DOB, address and insurance during PV today.  Pt mailed instruction packet of Emmi video, copy of consent form to read and not return, and instructions.  PV completed over the phone.  Pt encouraged to call with questions or issues.  My Chart instructions to pt as well    No egg or soy allergy known to patient  No issues known to pt with past sedation with any surgeries or procedures Patient denies ever being told they had issues or difficulty with intubation  No FH of Malignant Hyperthermia Pt is not on diet pills Pt is not on  home 02  Pt is not on blood thinners  Pt denies issues with constipation  No A fib or A flutter  EMMI video to pt or via MyChart    Pt is fully vaccinated  for Covid   Due to the COVID-19 pandemic we are asking patients to follow certain guidelines.  Pt aware of COVID protocols and [facility] guidelines

## 2021-01-11 ENCOUNTER — Encounter: Payer: Self-pay | Admitting: Gastroenterology

## 2021-01-11 DIAGNOSIS — M1711 Unilateral primary osteoarthritis, right knee: Secondary | ICD-10-CM | POA: Diagnosis not present

## 2021-01-11 DIAGNOSIS — I10 Essential (primary) hypertension: Secondary | ICD-10-CM | POA: Diagnosis not present

## 2021-01-23 ENCOUNTER — Ambulatory Visit (AMBULATORY_SURGERY_CENTER): Payer: Medicare HMO | Admitting: Gastroenterology

## 2021-01-23 ENCOUNTER — Other Ambulatory Visit: Payer: Self-pay

## 2021-01-23 ENCOUNTER — Encounter: Payer: Self-pay | Admitting: Gastroenterology

## 2021-01-23 VITALS — BP 133/78 | HR 60 | Temp 97.1°F | Resp 11 | Ht 69.0 in

## 2021-01-23 DIAGNOSIS — Z860100 Personal history of colon polyps, unspecified: Secondary | ICD-10-CM

## 2021-01-23 DIAGNOSIS — D128 Benign neoplasm of rectum: Secondary | ICD-10-CM

## 2021-01-23 DIAGNOSIS — K621 Rectal polyp: Secondary | ICD-10-CM

## 2021-01-23 DIAGNOSIS — I10 Essential (primary) hypertension: Secondary | ICD-10-CM | POA: Diagnosis not present

## 2021-01-23 DIAGNOSIS — F419 Anxiety disorder, unspecified: Secondary | ICD-10-CM | POA: Diagnosis not present

## 2021-01-23 MED ORDER — SODIUM CHLORIDE 0.9 % IV SOLN: 500.0000 mL | INTRAVENOUS | Status: DC

## 2021-01-23 NOTE — Patient Instructions (Signed)
Read all handouts provided to you today.   YOU HAD AN ENDOSCOPIC PROCEDURE TODAY AT [facility]:   Refer to the procedure report that was given to you for any specific questions about what was found during the examination.  If the procedure report does not answer your questions, please call your gastroenterologist to clarify.  If you requested that your care partner not be given the details of your procedure findings, then the procedure report has been included in a sealed envelope for you to review at your convenience later.  YOU SHOULD EXPECT: Some feelings of bloating in the abdomen. Passage of more gas than usual.  Walking can help get rid of the air that was put into your GI tract during the procedure and reduce the bloating. If you had a lower endoscopy (such as a colonoscopy or flexible sigmoidoscopy) you may notice spotting of blood in your stool or on the toilet paper. If you underwent a bowel prep for your procedure, you may not have a normal bowel movement for a few days.  Please Note:  You might notice some irritation and congestion in your nose or some drainage.  This is from the oxygen used during your procedure.  There is no need for concern and it should clear up in a day or so.  SYMPTOMS TO REPORT IMMEDIATELY:  Following lower endoscopy (colonoscopy or flexible sigmoidoscopy):  Excessive amounts of blood in the stool  Significant tenderness or worsening of abdominal pains  Swelling of the abdomen that is new, acute  Fever of 100F or higher  For urgent or emergent issues, a gastroenterologist can be reached at any hour by calling [phone]. Do not use MyChart messaging for urgent concerns.    DIET:  We do recommend a small meal at first, but then you may proceed to your regular diet.  Drink plenty of fluids but you should avoid alcoholic beverages for 24 hours.  ACTIVITY:  You should plan to take it easy for the rest of today and you should NOT DRIVE or  use heavy machinery until tomorrow (because of the sedation medicines used during the test).    FOLLOW UP: Our staff will call the number listed on your records 48-72 hours following your procedure to check on you and address any questions or concerns that you may have regarding the information given to you following your procedure. If we do not reach you, we will leave a message.  We will attempt to reach you two times.  During this call, we will ask if you have developed any symptoms of COVID 19. If you develop any symptoms (ie: fever, flu-like symptoms, shortness of breath, cough etc.) before then, please call [phone].  If you test positive for Covid 19 in the 2 weeks post procedure, please call and report this information to [place].    If any biopsies were taken you will be contacted by phone or by letter within the next 1-3 weeks.  Please call us at [phone] if you have not heard about the biopsies in 3 weeks.    SIGNATURES/CONFIDENTIALITY: You and/or your care partner have signed paperwork which will be entered into your electronic medical record.  These signatures attest to the fact that that the information above on your After Visit Summary has been reviewed and is understood.  Full responsibility of the confidentiality of this discharge information lies with you and/or your care-partner.

## 2021-01-23 NOTE — Progress Notes (Signed)
I have reviewed the patient's medical history in detail and updated the computerized patient record.

## 2021-01-23 NOTE — Op Note (Signed)
[facility] Patient Name: Maria Andrews Procedure Date: 01/23/2021 10:28 AM MRN: [id number] Endoscopist: Mauri Pole , MD Age: 70 Referring MD:  Date of Birth: [birth year] Gender: Female Account #: [id number] Procedure:                Colonoscopy Indications:              High risk colon cancer surveillance: Personal                            history of colonic polyps, High risk colon cancer                            surveillance: Personal history of adenoma less than                            10 mm in size Medicines:                Monitored Anesthesia Care Procedure:                Pre-Anesthesia Assessment:                           - Prior to the procedure, a History and Physical                            was performed, and patient medications and                            allergies were reviewed. The patient's tolerance of                            previous anesthesia was also reviewed. The risks                            and benefits of the procedure and the sedation                            options and risks were discussed with the patient.                            All questions were answered, and informed consent                            was obtained. Prior Anticoagulants: The patient has                            taken no previous anticoagulant or antiplatelet                            agents. ASA Grade Assessment: II - A patient with                            mild systemic disease. After reviewing the risks  and benefits, the patient was deemed in                            satisfactory condition to undergo the procedure.                           After obtaining informed consent, the colonoscope                            was passed under direct vision. Throughout the                            procedure, the patient's blood pressure, pulse, and                            oxygen saturations were monitored  continuously. The                            PCF-HQ190L Colonoscope was introduced through the                            anus and advanced to the the cecum, identified by                            appendiceal orifice and ileocecal valve. The                            colonoscopy was performed without difficulty. The                            patient tolerated the procedure well. The quality                            of the bowel preparation was excellent. The                            ileocecal valve, appendiceal orifice, and rectum                            were photographed. Scope In: 10:50:56 AM Scope Out: 11:04:22 AM Scope Withdrawal Time: 0 hours 8 minutes 16 seconds  Total Procedure Duration: 0 hours 13 minutes 26 seconds  Findings:                 The perianal and digital rectal examinations were                            normal.                           A 4 mm polyp was found in the rectum. The polyp was                            sessile. The polyp was removed with a cold snare.  Resection and retrieval were complete.                           Non-bleeding external and internal hemorrhoids were                            found during retroflexion. The hemorrhoids were                            medium-sized. Complications:            No immediate complications. Estimated Blood Loss:     Estimated blood loss was minimal. Impression:               - One 4 mm polyp in the rectum, removed with a cold                            snare. Resected and retrieved.                           - Non-bleeding external and internal hemorrhoids. Recommendation:           - Patient has a contact number available for                            emergencies. The signs and symptoms of potential                            delayed complications were discussed with the                            patient. Return to normal activities tomorrow.                             Written discharge instructions were provided to the                            patient.                           - Resume previous diet.                           - Continue present medications.                           - Await pathology results.                           - No repeat colonoscopy due to current age (61                            years or older). Mauri Pole, MD 01/23/2021 11:10:14 AM This report has been signed electronically.

## 2021-01-23 NOTE — Progress Notes (Signed)
[facility] History and Physical   Primary Care Physician:  Merrilee Seashore, MD   Reason for Procedure:  History of adenomatous colon polyps  Plan:    Surveillance colonoscopy with possible interventions as needed     HPI: Maria Andrews is a very pleasant 70 y.o. female here for colonoscopy. Denies any nausea, vomiting, abdominal pain, melena or bright red blood per rectum  The risks and benefits as well as alternatives of endoscopic procedure(s) have been discussed and reviewed. All questions answered. The patient agrees to proceed.    Past Medical History:  Diagnosis Date   Allergy    seasonal   Anxiety    past hx   Arthritis    Cataract    removed both eyes   Glaucoma    Heart murmur    dx'd as a young adult   Hypertension    Osteoarthritis    RIGHT KNEE AND HIP    Past Surgical History:  Procedure Laterality Date   cataract surgery Bilateral    COLONOSCOPY     FOOT SURGERY  04/14/2012   LEFT FOOT   KNEE ARTHROSCOPY Right 2016   POLYPECTOMY     urethra growth  04/14/2008   BENIGN    Prior to Admission medications   Medication Sig Start Date End Date Taking? Authorizing Provider  amLODipine (NORVASC) 5 MG tablet TAKE 1 TABLET BY MOUTH EVERY DAY FOR 30 DAYS   Yes [provider]  fluticasone (FLONASE) 50 MCG/ACT nasal spray  09/16/20  Yes [provider]  latanoprost (XALATAN) 0.005 % ophthalmic solution  09/14/20  Yes [provider]  levocetirizine (XYZAL) 5 MG tablet Take 5 mg by mouth every evening.   Yes [provider]  mirabegron ER (MYRBETRIQ) 50 MG TB24 tablet 1 tablet   Yes [provider]  montelukast (SINGULAIR) 10 MG tablet  09/20/20  Yes [provider]  timolol (TIMOPTIC) 0.5 % ophthalmic solution 1 drop every morning. 11/26/20  Yes [provider]  Cholecalciferol (VITAMIN D PO) Take by mouth. Patient not taking: No sig reported    [provider]  OVER  THE COUNTER MEDICATION Rexall night time sleep aide- diphenhydramine 50 mg  PRN for sleep Patient not taking: Reported on 01/23/2021    [provider]    Current Outpatient Medications  Medication Sig Dispense Refill   amLODipine (NORVASC) 5 MG tablet TAKE 1 TABLET BY MOUTH EVERY DAY FOR 30 DAYS     fluticasone (FLONASE) 50 MCG/ACT nasal spray      latanoprost (XALATAN) 0.005 % ophthalmic solution      levocetirizine (XYZAL) 5 MG tablet Take 5 mg by mouth every evening.     mirabegron ER (MYRBETRIQ) 50 MG TB24 tablet 1 tablet     montelukast (SINGULAIR) 10 MG tablet      timolol (TIMOPTIC) 0.5 % ophthalmic solution 1 drop every morning.     Cholecalciferol (VITAMIN D PO) Take by mouth. (Patient not taking: No sig reported)     OVER THE COUNTER MEDICATION Rexall night time sleep aide- diphenhydramine 50 mg  PRN for sleep (Patient not taking: Reported on 01/23/2021)     Current Facility-Administered Medications  Medication Dose Route Frequency Provider Last Rate Last Admin   0.9 %  sodium chloride infusion  500 mL Intravenous Continuous Mauri Pole, MD        Allergies as of 01/23/2021 - Review Complete 01/23/2021  Allergen Reaction Noted   Penicillins  02/21/2013  Family History  Problem Relation Age of Onset   Heart disease Mother    Diabetes Father    Diabetes Brother    Colon cancer Neg Hx    Colon polyps Neg Hx    Esophageal cancer Neg Hx    Rectal cancer Neg Hx    Stomach cancer Neg Hx     Social History   Socioeconomic History   Marital status: Divorced    Spouse name: Not on file   Number of children: Not on file   Years of education: Not on file   Highest education level: Not on file  Occupational History   Not on file  Tobacco Use   Smoking status: Every Day    Packs/day: 0.50    Types: Cigarettes   Smokeless tobacco: Never   Tobacco comments:    Half a pack or less   Substance and Sexual Activity   Alcohol use: Yes    Alcohol/week:  3.0 standard drinks    Types: 3 drink(s) per week    Comment: occ wines   Drug use: No   Sexual activity: Not on file  Other Topics Concern   Not on file  Social History Narrative   Not on file   Social Determinants of Health   Financial Resource Strain: Not on file  Food Insecurity: Not on file  Transportation Needs: Not on file  Physical Activity: Not on file  Stress: Not on file  Social Connections: Not on file  Intimate Partner Violence: Not on file    Review of Systems:  All other review of systems negative except as mentioned in the HPI.  Physical Exam: Vital signs in last 24 hours: BP 118/71   Pulse 72   Temp (!) 97.1 F (36.2 C)   Ht 5\' 9"  (1.753 m)   SpO2 97%   BMI 25.10 kg/m     General:   Alert, NAD Lungs:  Clear .   Heart:  Regular rate and rhythm Abdomen:  Soft, nontender and nondistended. Neuro/Psych:  Alert and cooperative. Normal mood and affect. A and O x 3  Reviewed labs, radiology imaging, old records and pertinent past GI work up  Patient is appropriate for planned procedure(s) and anesthesia in an ambulatory setting   K. Denzil Magnuson , MD [phone]

## 2021-01-23 NOTE — Progress Notes (Signed)
To PACU, VSS. Report to [email or web address] 

## 2021-01-23 NOTE — Progress Notes (Signed)
Called to room to assist during endoscopic procedure.  Patient ID and intended procedure confirmed with present staff. Received instructions for my participation in the procedure from the performing physician.  

## 2021-01-25 ENCOUNTER — Telehealth: Payer: Self-pay | Admitting: *Deleted

## 2021-01-25 NOTE — Telephone Encounter (Signed)
  Follow up Call-  Call back number 01/23/2021  Post procedure Call Back phone  # [phone]  Permission to leave phone message Yes  Some recent data might be hidden     Patient questions:  Do you have a fever, pain , or abdominal swelling? No. Pain Score  0 *  Have you tolerated food without any problems? Yes.    Have you been able to return to your normal activities? Yes.    Do you have any questions about your discharge instructions: Diet   No. Medications  No. Follow up visit  No.  Do you have questions or concerns about your Care? No.  Actions: * If pain score is 4 or above: No action needed, pain <4.  Have you developed a fever since your procedure? no  2.   Have you had an respiratory symptoms (SOB or cough) since your procedure? no  3.   Have you tested positive for COVID 19 since your procedure no  4.   Have you had any family members/close contacts diagnosed with the COVID 19 since your procedure?  no   If yes to any of these questions please route to Joylene John, RN and Joella Prince, RN

## 2021-02-07 ENCOUNTER — Encounter: Payer: Self-pay | Admitting: Gastroenterology

## 2021-02-08 DIAGNOSIS — M1711 Unilateral primary osteoarthritis, right knee: Secondary | ICD-10-CM | POA: Diagnosis not present

## 2021-02-11 DIAGNOSIS — I10 Essential (primary) hypertension: Secondary | ICD-10-CM | POA: Diagnosis not present

## 2021-02-11 DIAGNOSIS — M1711 Unilateral primary osteoarthritis, right knee: Secondary | ICD-10-CM | POA: Diagnosis not present

## 2021-03-13 DIAGNOSIS — M1711 Unilateral primary osteoarthritis, right knee: Secondary | ICD-10-CM | POA: Diagnosis not present

## 2021-03-13 DIAGNOSIS — I10 Essential (primary) hypertension: Secondary | ICD-10-CM | POA: Diagnosis not present

## 2021-03-27 DIAGNOSIS — H40009 Preglaucoma, unspecified, unspecified eye: Secondary | ICD-10-CM | POA: Diagnosis not present

## 2021-03-27 DIAGNOSIS — H52229 Regular astigmatism, unspecified eye: Secondary | ICD-10-CM | POA: Diagnosis not present

## 2021-04-10 DIAGNOSIS — I10 Essential (primary) hypertension: Secondary | ICD-10-CM | POA: Diagnosis not present

## 2021-04-12 DIAGNOSIS — M1711 Unilateral primary osteoarthritis, right knee: Secondary | ICD-10-CM | POA: Diagnosis not present

## 2021-04-12 DIAGNOSIS — I10 Essential (primary) hypertension: Secondary | ICD-10-CM | POA: Diagnosis not present

## 2021-04-16 DIAGNOSIS — W19XXXA Unspecified fall, initial encounter: Secondary | ICD-10-CM | POA: Diagnosis not present

## 2021-04-16 DIAGNOSIS — I10 Essential (primary) hypertension: Secondary | ICD-10-CM | POA: Diagnosis not present

## 2021-04-26 ENCOUNTER — Other Ambulatory Visit: Payer: Self-pay | Admitting: Internal Medicine

## 2021-04-26 DIAGNOSIS — Z1231 Encounter for screening mammogram for malignant neoplasm of breast: Secondary | ICD-10-CM

## 2021-05-13 DIAGNOSIS — M1711 Unilateral primary osteoarthritis, right knee: Secondary | ICD-10-CM | POA: Diagnosis not present

## 2021-05-14 DIAGNOSIS — M1711 Unilateral primary osteoarthritis, right knee: Secondary | ICD-10-CM | POA: Diagnosis not present

## 2021-05-14 DIAGNOSIS — I10 Essential (primary) hypertension: Secondary | ICD-10-CM | POA: Diagnosis not present

## 2021-06-04 DIAGNOSIS — R5383 Other fatigue: Secondary | ICD-10-CM | POA: Diagnosis not present

## 2021-06-04 DIAGNOSIS — Z0000 Encounter for general adult medical examination without abnormal findings: Secondary | ICD-10-CM | POA: Diagnosis not present

## 2021-06-04 DIAGNOSIS — I10 Essential (primary) hypertension: Secondary | ICD-10-CM | POA: Diagnosis not present

## 2021-06-05 ENCOUNTER — Ambulatory Visit
Admission: RE | Admit: 2021-06-05 | Discharge: 2021-06-05 | Disposition: A | Discharge status: Home / Self Care | Payer: Medicare HMO | Source: Ambulatory Visit

## 2021-06-05 DIAGNOSIS — Z1231 Encounter for screening mammogram for malignant neoplasm of breast: Secondary | ICD-10-CM

## 2021-06-11 DIAGNOSIS — M1711 Unilateral primary osteoarthritis, right knee: Secondary | ICD-10-CM | POA: Diagnosis not present

## 2021-06-11 DIAGNOSIS — I10 Essential (primary) hypertension: Secondary | ICD-10-CM | POA: Diagnosis not present

## 2021-06-11 DIAGNOSIS — N3941 Urge incontinence: Secondary | ICD-10-CM | POA: Diagnosis not present

## 2021-06-11 DIAGNOSIS — Z0000 Encounter for general adult medical examination without abnormal findings: Secondary | ICD-10-CM | POA: Diagnosis not present

## 2021-09-19 DIAGNOSIS — R69 Illness, unspecified: Secondary | ICD-10-CM | POA: Diagnosis not present

## 2022-02-24 DIAGNOSIS — M25521 Pain in right elbow: Secondary | ICD-10-CM | POA: Diagnosis not present

## 2022-02-24 DIAGNOSIS — M1711 Unilateral primary osteoarthritis, right knee: Secondary | ICD-10-CM | POA: Diagnosis not present

## 2022-02-25 DIAGNOSIS — M25621 Stiffness of right elbow, not elsewhere classified: Secondary | ICD-10-CM | POA: Diagnosis not present

## 2022-02-25 DIAGNOSIS — M6281 Muscle weakness (generalized): Secondary | ICD-10-CM | POA: Diagnosis not present

## 2022-02-25 DIAGNOSIS — S52124D Nondisplaced fracture of head of right radius, subsequent encounter for closed fracture with routine healing: Secondary | ICD-10-CM | POA: Diagnosis not present

## 2022-03-26 DIAGNOSIS — S52124D Nondisplaced fracture of head of right radius, subsequent encounter for closed fracture with routine healing: Secondary | ICD-10-CM | POA: Diagnosis not present

## 2022-04-02 DIAGNOSIS — S52124D Nondisplaced fracture of head of right radius, subsequent encounter for closed fracture with routine healing: Secondary | ICD-10-CM | POA: Diagnosis not present

## 2022-04-02 DIAGNOSIS — M6281 Muscle weakness (generalized): Secondary | ICD-10-CM | POA: Diagnosis not present

## 2022-04-02 DIAGNOSIS — M25621 Stiffness of right elbow, not elsewhere classified: Secondary | ICD-10-CM | POA: Diagnosis not present

## 2022-05-06 ENCOUNTER — Other Ambulatory Visit: Payer: Self-pay | Admitting: Internal Medicine

## 2022-05-06 DIAGNOSIS — Z1231 Encounter for screening mammogram for malignant neoplasm of breast: Secondary | ICD-10-CM

## 2022-05-07 DIAGNOSIS — S52124D Nondisplaced fracture of head of right radius, subsequent encounter for closed fracture with routine healing: Secondary | ICD-10-CM | POA: Diagnosis not present

## 2022-06-03 DIAGNOSIS — H5213 Myopia, bilateral: Secondary | ICD-10-CM | POA: Diagnosis not present

## 2022-06-04 DIAGNOSIS — S52124D Nondisplaced fracture of head of right radius, subsequent encounter for closed fracture with routine healing: Secondary | ICD-10-CM | POA: Diagnosis not present

## 2022-06-17 DIAGNOSIS — R5383 Other fatigue: Secondary | ICD-10-CM | POA: Diagnosis not present

## 2022-06-17 DIAGNOSIS — I10 Essential (primary) hypertension: Secondary | ICD-10-CM | POA: Diagnosis not present

## 2022-06-17 DIAGNOSIS — Z0000 Encounter for general adult medical examination without abnormal findings: Secondary | ICD-10-CM | POA: Diagnosis not present

## 2022-06-24 DIAGNOSIS — R002 Palpitations: Secondary | ICD-10-CM | POA: Diagnosis not present

## 2022-06-24 DIAGNOSIS — R42 Dizziness and giddiness: Secondary | ICD-10-CM | POA: Diagnosis not present

## 2022-06-24 DIAGNOSIS — Z0000 Encounter for general adult medical examination without abnormal findings: Secondary | ICD-10-CM | POA: Diagnosis not present

## 2022-06-24 DIAGNOSIS — N3941 Urge incontinence: Secondary | ICD-10-CM | POA: Diagnosis not present

## 2022-06-24 DIAGNOSIS — M1711 Unilateral primary osteoarthritis, right knee: Secondary | ICD-10-CM | POA: Diagnosis not present

## 2022-06-24 DIAGNOSIS — R5383 Other fatigue: Secondary | ICD-10-CM | POA: Diagnosis not present

## 2022-06-24 DIAGNOSIS — N182 Chronic kidney disease, stage 2 (mild): Secondary | ICD-10-CM | POA: Diagnosis not present

## 2022-06-24 DIAGNOSIS — I10 Essential (primary) hypertension: Secondary | ICD-10-CM | POA: Diagnosis not present

## 2022-06-25 DIAGNOSIS — R002 Palpitations: Secondary | ICD-10-CM | POA: Diagnosis not present

## 2022-06-25 DIAGNOSIS — R42 Dizziness and giddiness: Secondary | ICD-10-CM | POA: Diagnosis not present

## 2022-06-26 ENCOUNTER — Ambulatory Visit
Admission: RE | Admit: 2022-06-26 | Discharge: 2022-06-26 | Disposition: A | Discharge status: Home / Self Care | Payer: Medicare HMO | Source: Ambulatory Visit | Attending: Internal Medicine | Admitting: Internal Medicine

## 2022-06-26 DIAGNOSIS — Z1231 Encounter for screening mammogram for malignant neoplasm of breast: Secondary | ICD-10-CM | POA: Diagnosis not present

## 2022-07-15 DIAGNOSIS — J309 Allergic rhinitis, unspecified: Secondary | ICD-10-CM | POA: Diagnosis not present

## 2022-07-15 DIAGNOSIS — R42 Dizziness and giddiness: Secondary | ICD-10-CM | POA: Diagnosis not present

## 2022-12-24 DIAGNOSIS — M1711 Unilateral primary osteoarthritis, right knee: Secondary | ICD-10-CM | POA: Diagnosis not present

## 2023-05-04 DIAGNOSIS — M1811 Unilateral primary osteoarthritis of first carpometacarpal joint, right hand: Secondary | ICD-10-CM | POA: Diagnosis not present

## 2023-05-04 DIAGNOSIS — M1711 Unilateral primary osteoarthritis, right knee: Secondary | ICD-10-CM | POA: Diagnosis not present

## 2023-05-28 ENCOUNTER — Other Ambulatory Visit: Payer: Self-pay | Admitting: Internal Medicine

## 2023-05-28 DIAGNOSIS — Z0000 Encounter for general adult medical examination without abnormal findings: Secondary | ICD-10-CM

## 2023-06-01 IMAGING — MG MM DIGITAL SCREENING BILAT W/ TOMO AND CAD
8 series · 8 of 24 positions shown · non-contrast
Comparison: Previous exam(s).

CLINICAL DATA: Screening.

EXAM:
DIGITAL SCREENING BILATERAL MAMMOGRAM WITH TOMOSYNTHESIS AND CAD
TECHNIQUE: Bilateral screening digital craniocaudal and mediolateral oblique
mammograms were obtained. Bilateral screening digital breast
tomosynthesis was performed. The images were evaluated with
computer-aided detection.

[R CC synth-2D]
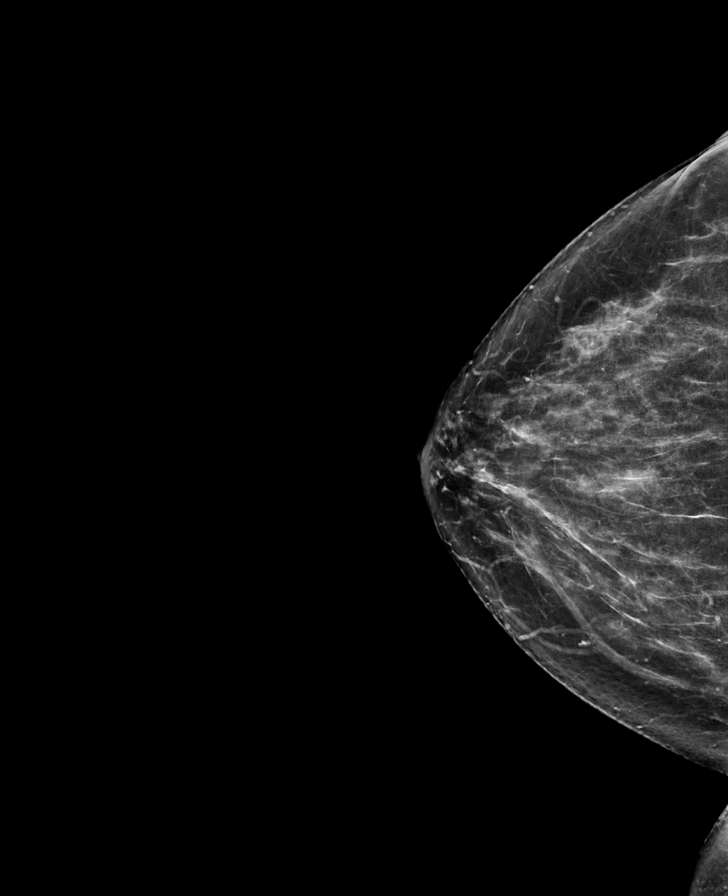

[R MLO synth-2D]
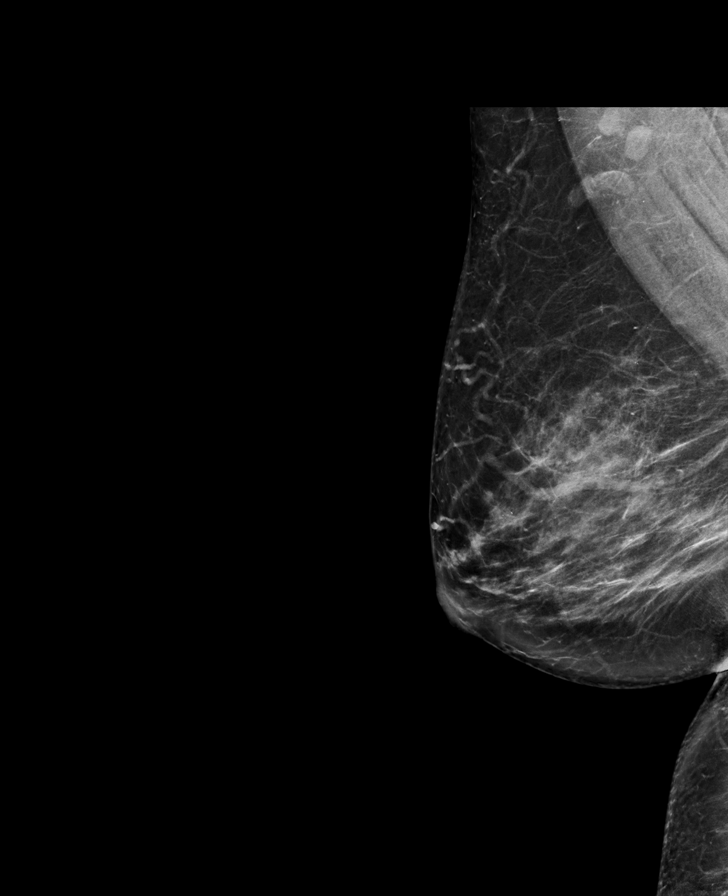

[L CC synth-2D]
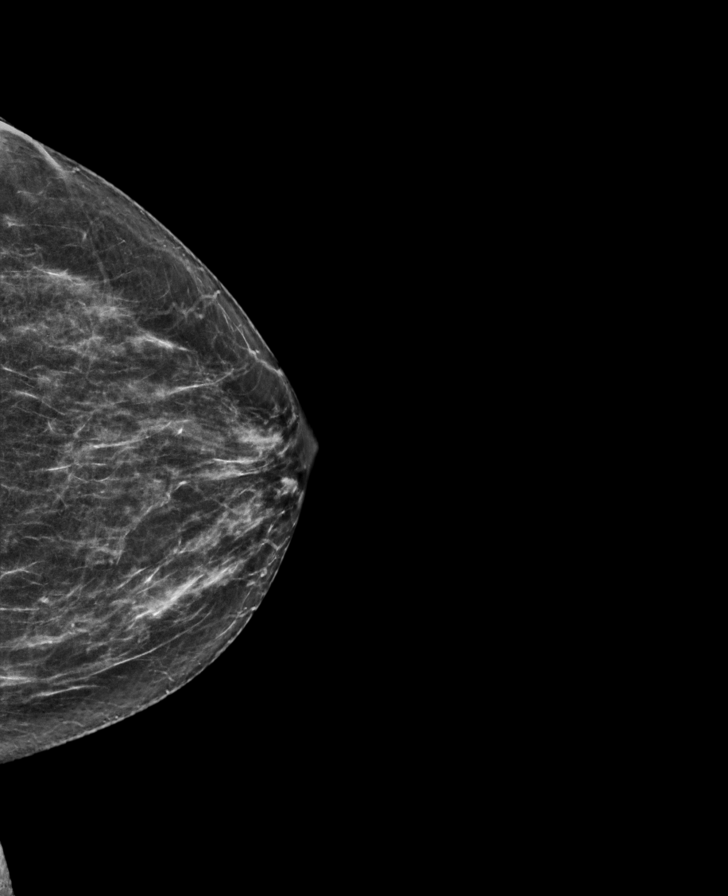

[L MLO synth-2D]
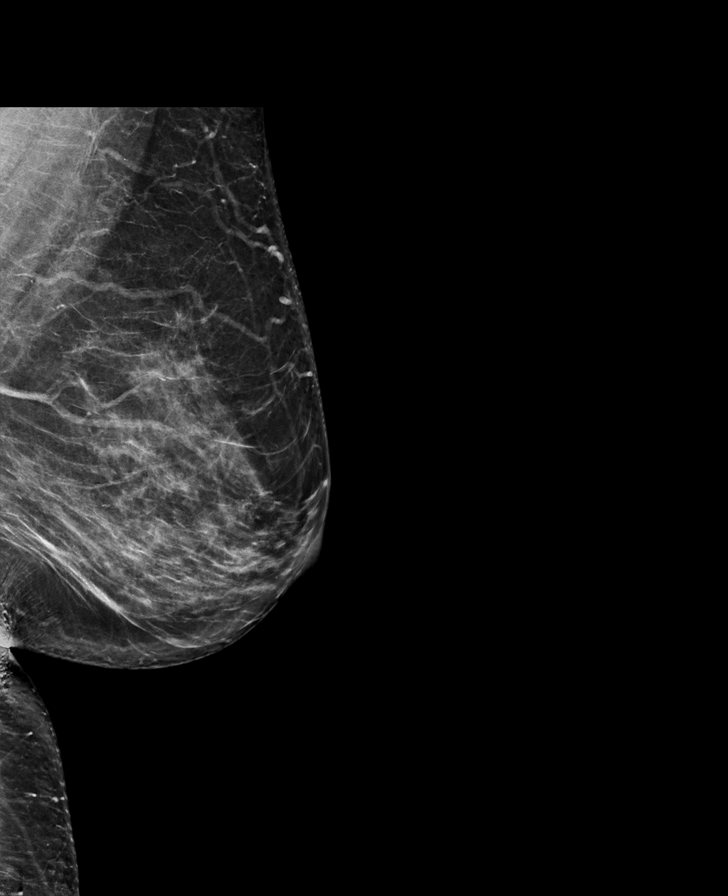

[R CC tomo · tomo slice 35/69.0]
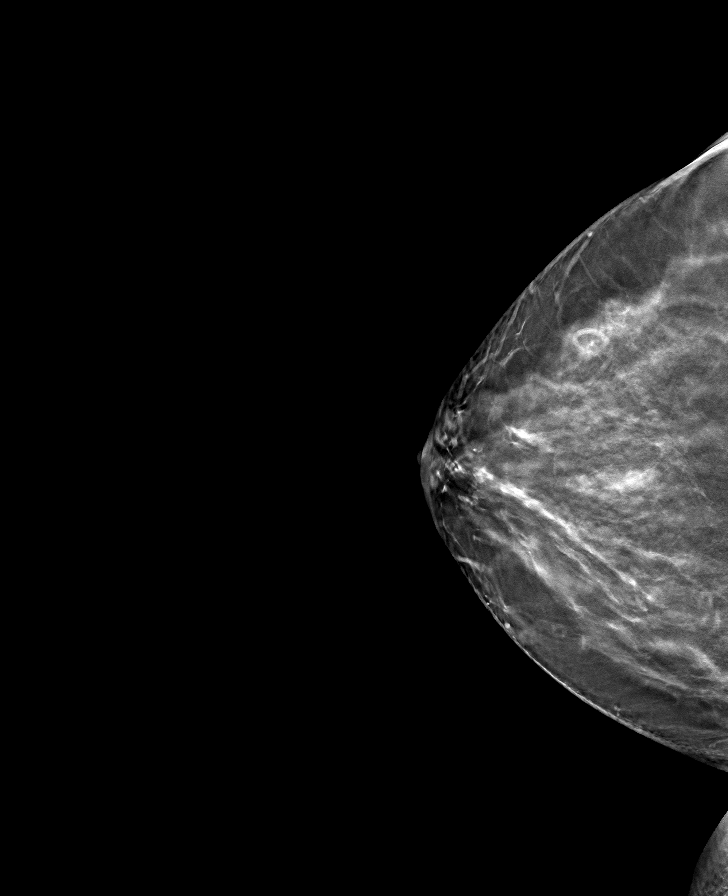

[L MLO tomo · tomo slice 39/78.0]
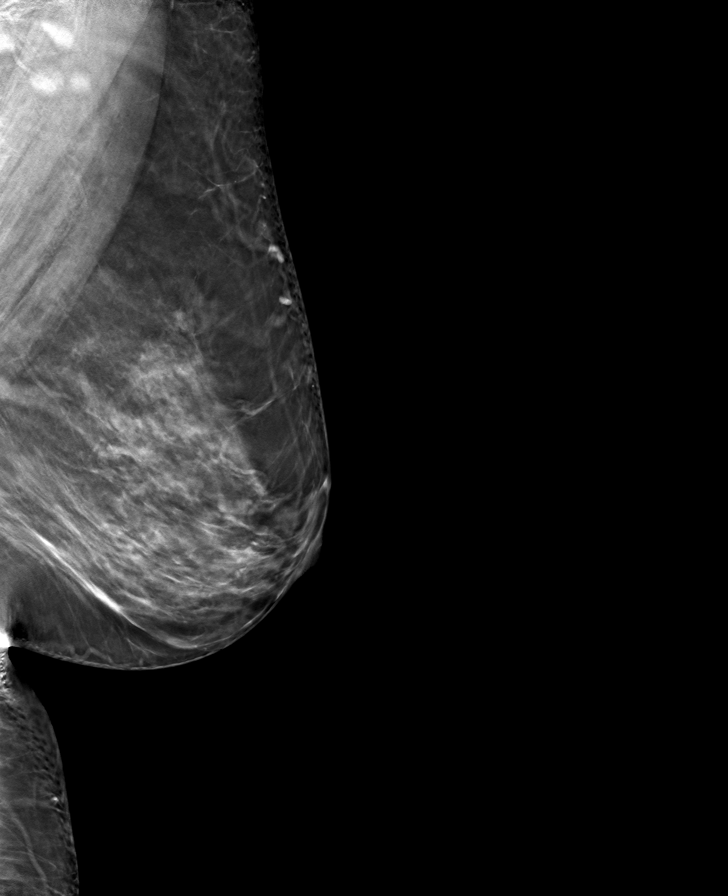

[L CC tomo · tomo slice 32/63.0]
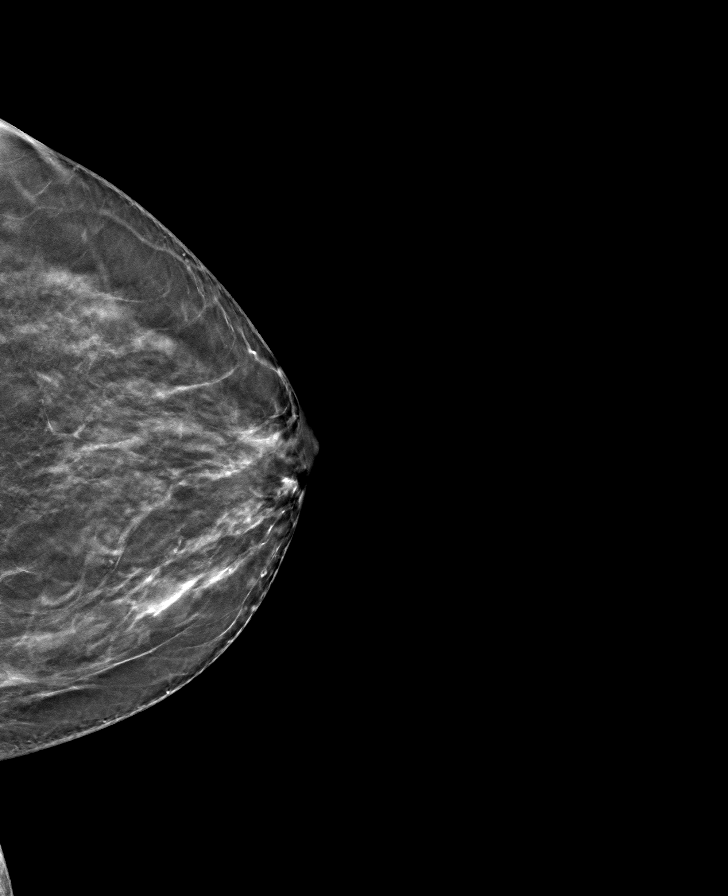

[R MLO tomo · tomo slice 40/79.0]
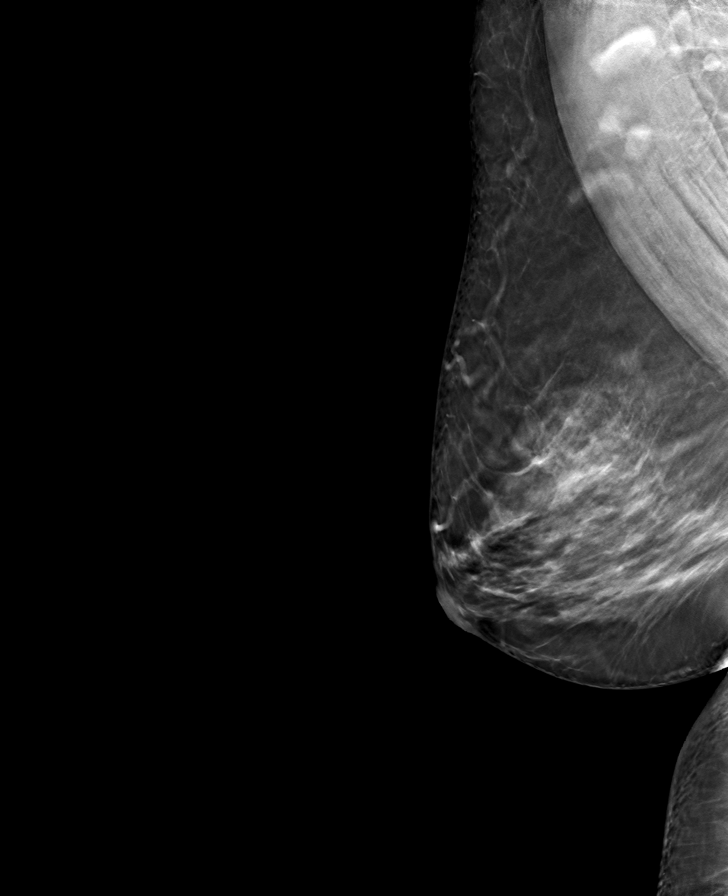

[8 of 24 positions shown; findings below may reference images not displayed]

ACR Breast Density Category c: The breast tissue is heterogeneously
dense, which may obscure small masses.
FINDINGS: There are no findings suspicious for malignancy.
IMPRESSION: No mammographic evidence of malignancy. A result letter of this
screening mammogram will be mailed directly to the patient.

RECOMMENDATION:
Screening mammogram in one year. (Code:Q3-W-BC3)

BI-RADS CATEGORY  1: Negative.

## 2023-06-29 ENCOUNTER — Ambulatory Visit
Admission: RE | Admit: 2023-06-29 | Discharge: 2023-06-29 | Disposition: A | Discharge status: Home / Self Care | Payer: Medicare HMO | Source: Ambulatory Visit | Attending: Internal Medicine | Admitting: Internal Medicine

## 2023-06-29 DIAGNOSIS — Z0000 Encounter for general adult medical examination without abnormal findings: Secondary | ICD-10-CM

## 2023-06-29 DIAGNOSIS — Z1231 Encounter for screening mammogram for malignant neoplasm of breast: Secondary | ICD-10-CM | POA: Diagnosis not present

## 2023-06-30 DIAGNOSIS — I10 Essential (primary) hypertension: Secondary | ICD-10-CM | POA: Diagnosis not present

## 2023-06-30 DIAGNOSIS — M1711 Unilateral primary osteoarthritis, right knee: Secondary | ICD-10-CM | POA: Diagnosis not present

## 2023-06-30 DIAGNOSIS — N182 Chronic kidney disease, stage 2 (mild): Secondary | ICD-10-CM | POA: Diagnosis not present

## 2023-06-30 DIAGNOSIS — Z0000 Encounter for general adult medical examination without abnormal findings: Secondary | ICD-10-CM | POA: Diagnosis not present

## 2023-06-30 DIAGNOSIS — R5383 Other fatigue: Secondary | ICD-10-CM | POA: Diagnosis not present

## 2023-06-30 DIAGNOSIS — N3941 Urge incontinence: Secondary | ICD-10-CM | POA: Diagnosis not present

## 2023-07-07 DIAGNOSIS — M1711 Unilateral primary osteoarthritis, right knee: Secondary | ICD-10-CM | POA: Diagnosis not present

## 2023-07-07 DIAGNOSIS — D509 Iron deficiency anemia, unspecified: Secondary | ICD-10-CM | POA: Diagnosis not present

## 2023-07-07 DIAGNOSIS — R5383 Other fatigue: Secondary | ICD-10-CM | POA: Diagnosis not present

## 2023-07-07 DIAGNOSIS — N182 Chronic kidney disease, stage 2 (mild): Secondary | ICD-10-CM | POA: Diagnosis not present

## 2023-07-07 DIAGNOSIS — N3941 Urge incontinence: Secondary | ICD-10-CM | POA: Diagnosis not present

## 2023-07-07 DIAGNOSIS — Z0000 Encounter for general adult medical examination without abnormal findings: Secondary | ICD-10-CM | POA: Diagnosis not present

## 2023-07-07 DIAGNOSIS — I10 Essential (primary) hypertension: Secondary | ICD-10-CM | POA: Diagnosis not present

## 2023-07-08 DIAGNOSIS — D509 Iron deficiency anemia, unspecified: Secondary | ICD-10-CM | POA: Diagnosis not present

## 2023-08-13 DIAGNOSIS — M1711 Unilateral primary osteoarthritis, right knee: Secondary | ICD-10-CM | POA: Diagnosis not present

## 2023-08-13 DIAGNOSIS — M25661 Stiffness of right knee, not elsewhere classified: Secondary | ICD-10-CM | POA: Diagnosis not present

## 2023-08-13 DIAGNOSIS — M25561 Pain in right knee: Secondary | ICD-10-CM | POA: Diagnosis not present

## 2023-08-13 DIAGNOSIS — R262 Difficulty in walking, not elsewhere classified: Secondary | ICD-10-CM | POA: Diagnosis not present

## 2023-08-20 DIAGNOSIS — R262 Difficulty in walking, not elsewhere classified: Secondary | ICD-10-CM | POA: Diagnosis not present

## 2023-08-20 DIAGNOSIS — M1711 Unilateral primary osteoarthritis, right knee: Secondary | ICD-10-CM | POA: Diagnosis not present

## 2023-08-20 DIAGNOSIS — M25661 Stiffness of right knee, not elsewhere classified: Secondary | ICD-10-CM | POA: Diagnosis not present

## 2023-08-20 DIAGNOSIS — M25561 Pain in right knee: Secondary | ICD-10-CM | POA: Diagnosis not present

## 2023-08-27 DIAGNOSIS — M25561 Pain in right knee: Secondary | ICD-10-CM | POA: Diagnosis not present

## 2023-08-27 DIAGNOSIS — M25661 Stiffness of right knee, not elsewhere classified: Secondary | ICD-10-CM | POA: Diagnosis not present

## 2023-08-27 DIAGNOSIS — M1711 Unilateral primary osteoarthritis, right knee: Secondary | ICD-10-CM | POA: Diagnosis not present

## 2023-08-27 DIAGNOSIS — H5213 Myopia, bilateral: Secondary | ICD-10-CM | POA: Diagnosis not present

## 2023-08-27 DIAGNOSIS — R262 Difficulty in walking, not elsewhere classified: Secondary | ICD-10-CM | POA: Diagnosis not present

## 2023-10-13 DIAGNOSIS — N182 Chronic kidney disease, stage 2 (mild): Secondary | ICD-10-CM | POA: Diagnosis not present

## 2023-10-13 DIAGNOSIS — D509 Iron deficiency anemia, unspecified: Secondary | ICD-10-CM | POA: Diagnosis not present

## 2023-10-13 DIAGNOSIS — R5383 Other fatigue: Secondary | ICD-10-CM | POA: Diagnosis not present

## 2023-10-13 DIAGNOSIS — I10 Essential (primary) hypertension: Secondary | ICD-10-CM | POA: Diagnosis not present

## 2023-10-20 DIAGNOSIS — D509 Iron deficiency anemia, unspecified: Secondary | ICD-10-CM | POA: Diagnosis not present

## 2023-10-20 DIAGNOSIS — N182 Chronic kidney disease, stage 2 (mild): Secondary | ICD-10-CM | POA: Diagnosis not present

## 2023-10-20 DIAGNOSIS — I10 Essential (primary) hypertension: Secondary | ICD-10-CM | POA: Diagnosis not present

## 2023-11-05 ENCOUNTER — Encounter: Payer: Self-pay | Admitting: Gastroenterology

## 2023-12-15 DIAGNOSIS — D509 Iron deficiency anemia, unspecified: Secondary | ICD-10-CM | POA: Diagnosis not present

## 2023-12-15 DIAGNOSIS — N182 Chronic kidney disease, stage 2 (mild): Secondary | ICD-10-CM | POA: Diagnosis not present

## 2023-12-15 DIAGNOSIS — I10 Essential (primary) hypertension: Secondary | ICD-10-CM | POA: Diagnosis not present

## 2023-12-22 DIAGNOSIS — M25552 Pain in left hip: Secondary | ICD-10-CM | POA: Diagnosis not present

## 2023-12-22 DIAGNOSIS — N182 Chronic kidney disease, stage 2 (mild): Secondary | ICD-10-CM | POA: Diagnosis not present

## 2023-12-22 DIAGNOSIS — D509 Iron deficiency anemia, unspecified: Secondary | ICD-10-CM | POA: Diagnosis not present

## 2023-12-22 DIAGNOSIS — M1711 Unilateral primary osteoarthritis, right knee: Secondary | ICD-10-CM | POA: Diagnosis not present

## 2023-12-22 DIAGNOSIS — N3941 Urge incontinence: Secondary | ICD-10-CM | POA: Diagnosis not present

## 2023-12-22 DIAGNOSIS — I10 Essential (primary) hypertension: Secondary | ICD-10-CM | POA: Diagnosis not present

## 2024-01-18 ENCOUNTER — Encounter: Payer: Self-pay | Admitting: Gastroenterology

## 2024-01-18 ENCOUNTER — Ambulatory Visit: Admitting: Gastroenterology

## 2024-01-18 VITALS — BP 116/70 | HR 84 | Ht 67.0 in | Wt 159.1 lb

## 2024-01-18 DIAGNOSIS — D509 Iron deficiency anemia, unspecified: Secondary | ICD-10-CM | POA: Diagnosis not present

## 2024-01-18 DIAGNOSIS — D500 Iron deficiency anemia secondary to blood loss (chronic): Secondary | ICD-10-CM

## 2024-01-18 MED ORDER — NA SULFATE-K SULFATE-MG SULF 17.5-3.13-1.6 GM/177ML PO SOLN: 1.0000 | ORAL | 0 refills | Status: DC

## 2024-01-18 NOTE — Patient Instructions (Addendum)
 VISIT SUMMARY:  Today, we evaluated your chronic iron deficiency anemia and discussed your recent lab results and symptoms. We also reviewed your gastrointestinal health and nutritional intake.  YOUR PLAN:  IRON DEFICIENCY ANEMIA: You have chronic iron deficiency anemia with low hemoglobin levels and low iron saturation. This may be due to chronic gastrointestinal blood loss. -Increase your Fusion Plus iron supplement to twice daily if you can tolerate it. -We have scheduled an upper endoscopy and colonoscopy on November 21st to investigate potential sources of gastrointestinal bleeding. -If the endoscopy and colonoscopy do not reveal the source of bleeding, we may consider a small bowel video capsule endoscopy. -Try to include more iron-rich foods in your diet, such as raisins, dates, greens, and eggs.  We have sent the following medications to your pharmacy for you to pick up at your convenience: Suprep   You have been scheduled for an endoscopy and colonoscopy. Please follow the written instructions given to you at your visit today.  If you use inhalers (even only as needed), please bring them with you on the day of your procedure.  DO NOT TAKE 7 DAYS PRIOR TO TEST- Trulicity (dulaglutide) Ozempic, Wegovy (semaglutide) Mounjaro (tirzepatide) Bydureon Bcise (exanatide extended release)  DO NOT TAKE 1 DAY PRIOR TO YOUR TEST Rybelsus (semaglutide) Adlyxin (lixisenatide) Victoza (liraglutide) Byetta (exanatide) ____________________________________________________________  Thank you for choosing me and [facility].  Dr. Kavitha Nandigam

## 2024-01-18 NOTE — Progress Notes (Signed)
 Maria Andrews    [id number]    [birth year]  Primary Care Physician:Ramachandran, Lequita, MD  Referring Physician: Verdia Lequita, MD [address] [place],  [place] [address]   Chief complaint:  Iron deficiency anemia  Discussed the use of AI scribe software for clinical note transcription with the patient, who gave verbal consent to proceed.  History of Present Illness Maria Andrews is a 73 year old female who presents for evaluation of iron deficiency anemia. She was referred by Dr. Morrell for evaluation of iron deficiency anemia.  Iron deficiency anemia - Patient reports history of intermittent chronic anemia since childhood - Recent hemoglobin levels: 10.4 g/dL (December 15, 2023), 9.8 g/dL (July 7974) - Iron saturation 8% - Ferritin increased from 12 ng/mL (July 2025) to 17 ng/mL (December 15, 2023) on oral iron supplements - Takes Fusion Plus iron supplements once daily, finds it helpful. - No current symptoms of stomach pain or heartburn - No significant weight changes, slight increase to 159-160 pounds attributed to decreased physical activity since stopping work  Gastrointestinal bleeding - Single episode of blood on tissue after wiping earlier in the year, possibly due to hemorrhoid - No current gastrointestinal bleeding symptoms  Menstrual and gynecologic history - Postmenopausal, no menstrual blood loss since her sixties  Colorectal cancer screening and polyp history - Two colonoscopies: 2015 and 2022, with small polyps removed each time - No history of upper endoscopy  Nutritional intake - Picky eater, prefers home-cooked and fresh foods - Does not eat breakfast regularly, consumes it later in the day - Includes eggs in diet   Colonoscopy 01/23/2021 - One 4 mm polyp in the rectum, removed with a cold snare. Resected and retrieved.  - Non- bleeding external and internal hemorrhoids.  Colonoscopy  12/09/2013 Diminutive sessile polyp was found in the ascending colon; polypectomy was performed with cold forceps    Outpatient Encounter Medications as of 01/18/2024  Medication Sig   acetaminophen (TYLENOL) 650 MG CR tablet Take 650 mg by mouth every 8 (eight) hours as needed for pain.   amLODipine (NORVASC) 5 MG tablet TAKE 1 TABLET BY MOUTH EVERY DAY FOR 30 DAYS   Calcium Carbonate (CALCIUM 500 PO) Take 1 tablet by mouth daily.   Cholecalciferol (VITAMIN D PO) Take by mouth.   Collagen-Vitamin C-Biotin (COLLAGEN PO) Take 1 tablet by mouth daily.   cyanocobalamin (VITAMIN B12) 1000 MCG tablet Take 1,000 mcg by mouth daily.   fluticasone (FLONASE) 50 MCG/ACT nasal spray    hydrochlorothiazide (HYDRODIURIL) 25 MG tablet Take 25 mg by mouth daily.   Iron-FA-B Cmp-C-Biot-Probiotic (FUSION PLUS PO) Take 1 tablet by mouth See admin instructions.   latanoprost (XALATAN) 0.005 % ophthalmic solution    levocetirizine (XYZAL) 5 MG tablet Take 5 mg by mouth every evening.   meloxicam (MOBIC) 15 MG tablet Take 15 mg by mouth as needed.   montelukast (SINGULAIR) 10 MG tablet    OVER THE COUNTER MEDICATION Rexall night time sleep aide- diphenhydramine 50 mg  PRN for sleep   solifenacin (VESICARE) 5 MG tablet Take 5 mg by mouth as needed.   timolol (TIMOPTIC) 0.5 % ophthalmic solution 1 drop every morning.   [DISCONTINUED] mirabegron ER (MYRBETRIQ) 50 MG TB24 tablet 1 tablet   No facility-administered encounter medications on file as of 01/18/2024.    Allergies as of 01/18/2024 - Review Complete 01/18/2024  Allergen Reaction Noted   Penicillins  02/21/2013  Past Medical History:  Diagnosis Date   Allergy    seasonal   Anxiety    past hx   Arthritis    Cataract    removed both eyes   Glaucoma    Heart murmur    dx'd as a young adult   Hypertension    Osteoarthritis    RIGHT KNEE AND HIP    Past Surgical History:  Procedure Laterality Date   cataract surgery Bilateral     COLONOSCOPY     FOOT SURGERY  04/14/2012   LEFT FOOT   KNEE ARTHROSCOPY Right 2016   POLYPECTOMY     urethra growth  04/14/2008   BENIGN    Family History  Problem Relation Age of Onset   Heart disease Mother    Diabetes Father    Diabetes Brother    Colon cancer Neg Hx    Colon polyps Neg Hx    Esophageal cancer Neg Hx    Rectal cancer Neg Hx    Stomach cancer Neg Hx     Social History   Socioeconomic History   Marital status: Divorced    Spouse name: Not on file   Number of children: Not on file   Years of education: Not on file   Highest education level: Not on file  Occupational History   Not on file  Tobacco Use   Smoking status: Every Day    Current packs/day: 0.50    Types: Cigarettes   Smokeless tobacco: Never   Tobacco comments:    Half a pack or less   Substance and Sexual Activity   Alcohol use: Yes    Alcohol/week: 3.0 standard drinks of alcohol    Types: 3 drink(s) per week    Comment: occ wines   Drug use: No   Sexual activity: Not on file  Other Topics Concern   Not on file  Social History Narrative   Not on file   Social Drivers of Health   Financial Resource Strain: Not on file  Food Insecurity: Not on file  Transportation Needs: Not on file  Physical Activity: Not on file  Stress: Not on file  Social Connections: Not on file  Intimate Partner Violence: Not on file      Review of systems: All other review of systems negative except as mentioned in the HPI.   Physical Exam: Vitals:   01/18/24 1345  BP: 116/70  Pulse: 84   Body mass index is 24.92 kg/m. Gen:      No acute distress HEENT:  sclera anicteric CV: s1s2 rrr, no murmur Lungs: B/l clear. Abd:      soft, non-tender; no palpable masses, no distension Ext:    No edema Neuro: alert and oriented x 3 Psych: normal mood and affect  Data Reviewed:  Reviewed labs, radiology imaging, old records and pertinent past GI work up   Assessment and Plan Assessment &  Plan Iron deficiency anemia Iron deficiency anemia with low hemoglobin at 10.4 g/dL, previously 9.8 g/dL. Iron saturation is 8%, and ferritin 17.  Differential includes gastric ulcers, gastritis, colonic polyps, or small bowel bleeding AVM. Currently on Fusion Plus iron supplement once daily. Reports dark stools, a common side effect. No recent visible blood in stool, except once earlier this year, possibly hemorrhoids. - Increase Fusion Plus iron supplement to twice daily if tolerated. - Schedule upper endoscopy and colonoscopy on November 21st to investigate potential gastrointestinal bleeding source for blood loss and iron deficiency. - Consider small  bowel video capsule endoscopy if upper endoscopy and colonoscopy do not reveal the source of bleeding. - Encourage dietary intake of iron-rich foods such as raisins, dates, greens, and eggs.  The risks and benefits as well as alternatives of endoscopic procedure(s) have been discussed and reviewed. All questions answered. The patient agrees to proceed.      The patient was provided an opportunity to ask questions and all were answered. The patient agreed with the plan and demonstrated an understanding of the instructions.  LOIS Wilkie Mcgee , MD    CC: Verdia Lombard, MD

## 2024-02-26 ENCOUNTER — Other Ambulatory Visit: Payer: Self-pay | Admitting: Medical Genetics

## 2024-02-26 ENCOUNTER — Encounter: Payer: Self-pay | Admitting: Gastroenterology

## 2024-03-04 ENCOUNTER — Encounter: Payer: Self-pay | Admitting: Gastroenterology

## 2024-03-04 ENCOUNTER — Ambulatory Visit: Admitting: Gastroenterology

## 2024-03-04 VITALS — BP 146/81 | HR 64 | Temp 97.4°F | Resp 15 | Ht 67.0 in | Wt 159.0 lb

## 2024-03-04 DIAGNOSIS — D500 Iron deficiency anemia secondary to blood loss (chronic): Secondary | ICD-10-CM

## 2024-03-04 DIAGNOSIS — D509 Iron deficiency anemia, unspecified: Secondary | ICD-10-CM | POA: Diagnosis not present

## 2024-03-04 DIAGNOSIS — D125 Benign neoplasm of sigmoid colon: Secondary | ICD-10-CM

## 2024-03-04 DIAGNOSIS — K31819 Angiodysplasia of stomach and duodenum without bleeding: Secondary | ICD-10-CM | POA: Diagnosis not present

## 2024-03-04 DIAGNOSIS — K648 Other hemorrhoids: Secondary | ICD-10-CM

## 2024-03-04 DIAGNOSIS — K6389 Other specified diseases of intestine: Secondary | ICD-10-CM | POA: Diagnosis not present

## 2024-03-04 DIAGNOSIS — K644 Residual hemorrhoidal skin tags: Secondary | ICD-10-CM

## 2024-03-04 MED ORDER — SODIUM CHLORIDE 0.9 % IV SOLN: 500.0000 mL | Freq: Once | INTRAVENOUS | Status: DC

## 2024-03-04 MED ORDER — PANTOPRAZOLE SODIUM 20 MG PO TBEC: 20.0000 mg | DELAYED_RELEASE_TABLET | Freq: Two times a day (BID) | ORAL | 3 refills | Status: AC

## 2024-03-04 NOTE — Patient Instructions (Addendum)
 Continue present medications. Await pathology results. No repeat colonoscopy due to age.  Please read over handouts provided  Use Protonix  (pantoprazole ) 20 mg PO BID. IV Iron infusion (Venofer) Routine CBC check every months and iron panel with ferritin every 3 months through PCP Follow up in GI office in 3 months with Dr.Nandigam or POD C APP   YOU HAD AN ENDOSCOPIC PROCEDURE TODAY AT THE [facility]:   Refer to the procedure report that was given to you for any specific questions about what was found during the examination.  If the procedure report does not answer your questions, please call your gastroenterologist to clarify.  If you requested that your care partner not be given the details of your procedure findings, then the procedure report has been included in a sealed envelope for you to review at your convenience later.  YOU SHOULD EXPECT: Some feelings of bloating in the abdomen. Passage of more gas than usual.  Walking can help get rid of the air that was put into your GI tract during the procedure and reduce the bloating. If you had a lower endoscopy (such as a colonoscopy or flexible sigmoidoscopy) you may notice spotting of blood in your stool or on the toilet paper. If you underwent a bowel prep for your procedure, you may not have a normal bowel movement for a few days.  Please Note:  You might notice some irritation and congestion in your nose or some drainage.  This is from the oxygen used during your procedure.  There is no need for concern and it should clear up in a day or so.  SYMPTOMS TO REPORT IMMEDIATELY:  Following lower endoscopy (colonoscopy or flexible sigmoidoscopy):  Excessive amounts of blood in the stool  Significant tenderness or worsening of abdominal pains  Swelling of the abdomen that is new, acute  Fever of 100F or higher  Following upper endoscopy (EGD)  Vomiting of blood or coffee ground material  New chest pain or pain under the  shoulder blades  Painful or persistently difficult swallowing  New shortness of breath  Fever of 100F or higher  Black, tarry-looking stools  For urgent or emergent issues, a gastroenterologist can be reached at any hour by calling [phone]. Do not use MyChart messaging for urgent concerns.    DIET:  We do recommend a small meal at first, but then you may proceed to your regular diet.  Drink plenty of fluids but you should avoid alcoholic beverages for 24 hours.  ACTIVITY:  You should plan to take it easy for the rest of today and you should NOT DRIVE or use heavy machinery until tomorrow (because of the sedation medicines used during the test).    FOLLOW UP: Our staff will call the number listed on your records the next business day following your procedure.  We will call around 7:15- 8:00 am to check on you and address any questions or concerns that you may have regarding the information given to you following your procedure. If we do not reach you, we will leave a message.     If any biopsies were taken you will be contacted by phone or by letter within the next 1-3 weeks.  Please call us  at [phone] if you have not heard about the biopsies in 3 weeks.    SIGNATURES/CONFIDENTIALITY: You and/or your care partner have signed paperwork which will be entered into your electronic medical record.  These signatures attest to the fact that that  the information above on your After Visit Summary has been reviewed and is understood.  Full responsibility of the confidentiality of this discharge information lies with you and/or your care-partner.

## 2024-03-04 NOTE — Progress Notes (Signed)
 [facility] History and Physical   Primary Care Physician:  Verdia Lombard, MD   Reason for Procedure:  Iron deficiency anemia  Plan:    EGD and colonoscopy with possible interventions as needed     HPI: Maria Andrews is a very pleasant 73 y.o. female here for iron deficiency anemia.   The risks and benefits as well as alternatives of endoscopic procedure(s) have been discussed and reviewed.  The patient was provided an opportunity to ask questions and all were answered. The patient agreed with the plan and demonstrated an understanding of the instructions.   Past Medical History:  Diagnosis Date   Allergy    seasonal   Anxiety    past hx   Arthritis    Cataract    removed both eyes   Glaucoma    Heart murmur    dx'd as a young adult   Hypertension    Osteoarthritis    RIGHT KNEE AND HIP    Past Surgical History:  Procedure Laterality Date   cataract surgery Bilateral    COLONOSCOPY     FOOT SURGERY  04/14/2012   LEFT FOOT   KNEE ARTHROSCOPY Right 2016   POLYPECTOMY     urethra growth  04/14/2008   BENIGN    Prior to Admission medications   Medication Sig Start Date End Date Taking? Authorizing Provider  amLODipine (NORVASC) 5 MG tablet TAKE 1 TABLET BY MOUTH EVERY DAY FOR 30 DAYS   Yes [provider]  hydrochlorothiazide (HYDRODIURIL) 25 MG tablet Take 25 mg by mouth daily. 12/30/23  Yes [provider]  latanoprost (XALATAN) 0.005 % ophthalmic solution  09/14/20  Yes [provider]  meloxicam (MOBIC) 15 MG tablet Take 15 mg by mouth as needed. 10/14/23  Yes [provider]  timolol (TIMOPTIC) 0.5 % ophthalmic solution 1 drop every morning. 11/26/20  Yes [provider]  acetaminophen (TYLENOL) 650 MG CR tablet Take 650 mg by mouth every 8 (eight) hours as needed for pain.    [provider]  Calcium Carbonate (CALCIUM 500 PO) Take 1 tablet by mouth daily.    [provider]   Cholecalciferol (VITAMIN D PO) Take by mouth.    [provider]  Collagen-Vitamin C-Biotin (COLLAGEN PO) Take 1 tablet by mouth daily.    [provider]  cyanocobalamin (VITAMIN B12) 1000 MCG tablet Take 1,000 mcg by mouth daily.    [provider]  fluticasone OREN) 50 MCG/ACT nasal spray  09/16/20   [provider]  Iron-FA-B Cmp-C-Biot-Probiotic (FUSION PLUS PO) Take 1 tablet by mouth See admin instructions.    [provider]  levocetirizine (XYZAL) 5 MG tablet Take 5 mg by mouth every evening.    [provider]  montelukast (SINGULAIR) 10 MG tablet  09/20/20   [provider]  OVER THE COUNTER MEDICATION Rexall night time sleep aide- diphenhydramine 50 mg  PRN for sleep    [provider]  solifenacin (VESICARE) 5 MG tablet Take 5 mg by mouth as needed. 09/28/23   [provider]    Current Outpatient Medications  Medication Sig Dispense Refill   amLODipine (NORVASC) 5 MG tablet TAKE 1 TABLET BY MOUTH EVERY DAY FOR 30 DAYS     hydrochlorothiazide (HYDRODIURIL) 25 MG tablet Take 25 mg by mouth daily.     latanoprost (XALATAN) 0.005 % ophthalmic solution      meloxicam (MOBIC) 15 MG tablet Take 15 mg by mouth as needed.  timolol (TIMOPTIC) 0.5 % ophthalmic solution 1 drop every morning.     acetaminophen (TYLENOL) 650 MG CR tablet Take 650 mg by mouth every 8 (eight) hours as needed for pain.     Calcium Carbonate (CALCIUM 500 PO) Take 1 tablet by mouth daily.     Cholecalciferol (VITAMIN D PO) Take by mouth.     Collagen-Vitamin C-Biotin (COLLAGEN PO) Take 1 tablet by mouth daily.     cyanocobalamin (VITAMIN B12) 1000 MCG tablet Take 1,000 mcg by mouth daily.     fluticasone (FLONASE) 50 MCG/ACT nasal spray      Iron-FA-B Cmp-C-Biot-Probiotic (FUSION PLUS PO) Take 1 tablet by mouth See admin instructions.     levocetirizine (XYZAL) 5 MG tablet Take 5 mg by mouth every evening.     montelukast  (SINGULAIR) 10 MG tablet      OVER THE COUNTER MEDICATION Rexall night time sleep aide- diphenhydramine 50 mg  PRN for sleep     solifenacin (VESICARE) 5 MG tablet Take 5 mg by mouth as needed.     Current Facility-Administered Medications  Medication Dose Route Frequency Provider Last Rate Last Admin   0.9 %  sodium chloride  infusion  500 mL Intravenous Once Bastion Bolger V, MD        Allergies as of 03/04/2024 - Review Complete 03/04/2024  Allergen Reaction Noted   Penicillins Other (See Comments) 02/21/2013    Family History  Problem Relation Age of Onset   Heart disease Mother    Diabetes Father    Hypertension Father    Hypertension Sister    Diabetes Sister    Diabetes Brother    Hypertension Brother    Colon cancer Neg Hx    Colon polyps Neg Hx    Esophageal cancer Neg Hx    Rectal cancer Neg Hx    Stomach cancer Neg Hx     Social History   Socioeconomic History   Marital status: Divorced    Spouse name: Not on file   Number of children: 3   Years of education: Not on file   Highest education level: Not on file  Occupational History   Occupation: retired  Tobacco Use   Smoking status: Every Day    Current packs/day: 0.50    Types: Cigarettes   Smokeless tobacco: Never   Tobacco comments:    Half a pack or less   Vaping Use   Vaping status: Never Used  Substance and Sexual Activity   Alcohol use: Yes    Alcohol/week: 3.0 standard drinks of alcohol    Types: 3 drink(s) per week    Comment: occ wines   Drug use: No   Sexual activity: Not on file  Other Topics Concern   Not on file  Social History Narrative   Not on file   Social Drivers of Health   Financial Resource Strain: Not on file  Food Insecurity: Not on file  Transportation Needs: Not on file  Physical Activity: Not on file  Stress: Not on file  Social Connections: Not on file  Intimate Partner Violence: Not on file    Review of Systems:  All other review of systems negative  except as mentioned in the HPI.  Physical Exam: Vital signs in last 24 hours: BP 102/71   Pulse 84   Temp (!) 97.4 F (36.3 C) (Skin)   Ht 5' 7 (1.702 m)   Wt 159 lb (72.1 kg)   SpO2 100%   BMI 24.90 kg/m  General:   Alert, NAD Lungs:  Clear .   Heart:  Regular rate and rhythm Abdomen:  Soft, nontender and nondistended. Neuro/Psych:  Alert and cooperative. Normal mood and affect. A and O x 3  Reviewed labs, radiology imaging, old records and pertinent past GI work up  Patient is appropriate for planned procedure(s) and anesthesia in an ambulatory setting   K. Veena Shareen Capwell , MD [phone]

## 2024-03-04 NOTE — Progress Notes (Signed)
 Recall put in for OV in 3 months- February 2026 schedule note open yet to schedule appointment

## 2024-03-04 NOTE — Progress Notes (Signed)
 Called to room to assist during endoscopic procedure.  Patient ID and intended procedure confirmed with present staff. Received instructions for my participation in the procedure from the performing physician.

## 2024-03-04 NOTE — Progress Notes (Signed)
Pt. states no medical or surgical changes since previsit or office visit. 

## 2024-03-04 NOTE — Progress Notes (Signed)
 Transferred to PACU via stretcher. Patient arousing to stimulation.  VSS upon leaving procedure room.

## 2024-03-04 NOTE — Progress Notes (Deleted)
 Transferred to PACU via stretcher. Patient arousing to stimulation.  VSS upon leaving procedure room.

## 2024-03-04 NOTE — Op Note (Signed)
 [facility] Patient Name: Maria Andrews Procedure Date: 03/04/2024 10:02 AM MRN: [id number] Endoscopist: Gustav ALONSO Mcgee , MD, [id number] Age: 73 Referring MD:  Date of Birth: 01-Aug-1950 Gender: Female Account #: [id number] Procedure:                Upper GI endoscopy Indications:              Suspected upper gastrointestinal bleeding in                            patient with unexplained iron deficiency anemia Medicines:                Monitored Anesthesia Care Procedure:                Pre-Anesthesia Assessment:                           - Prior to the procedure, a History and Physical                            was performed, and patient medications and                            allergies were reviewed. The patient's tolerance of                            previous anesthesia was also reviewed. The risks                            and benefits of the procedure and the sedation                            options and risks were discussed with the patient.                            All questions were answered, and informed consent                            was obtained. Prior Anticoagulants: The patient has                            taken no anticoagulant or antiplatelet agents. ASA                            Grade Assessment: II - A patient with mild systemic                            disease. After reviewing the risks and benefits,                            the patient was deemed in satisfactory condition to                            undergo the procedure.  After obtaining informed consent, the endoscope was                            passed under direct vision. Throughout the                            procedure, the patient's blood pressure, pulse, and                            oxygen saturations were monitored continuously. The                            GIF [id number] was introduced through the                            mouth,  and advanced to the second part of duodenum.                            The upper GI endoscopy was accomplished without                            difficulty. The patient tolerated the procedure                            well. Scope In: Scope Out: Findings:                 The Z-line was regular and was found 38 cm from the                            incisors.                           No gross lesions were noted in the entire esophagus.                           Three less than 1 mm angioectasias with no bleeding                            were found in the gastric fundus, in the gastric                            body and in the prepyloric region of the stomach.                           The exam of the stomach was otherwise normal.                           Four less than 1 mm angioectasias without bleeding                            were found in the duodenal bulb and in the second  portion of the duodenum. Complications:            No immediate complications. Estimated Blood Loss:     Estimated blood loss was minimal. Impression:               - Z-line regular, 38 cm from the incisors.                           - No gross lesions in the entire esophagus.                           - Three non-bleeding angioectasias in the stomach.                           - Four non-bleeding angioectasias in the duodenum.                           - No specimens collected. Recommendation:           - Resume previous diet.                           - Continue present medications.                           - Use Protonix  (pantoprazole ) 20 mg PO BID.                           - IV Iron infusion (Venofer)                           - Routine CBC check every months and iron panel                            with ferritin every 3 months through PCP                           - Follow up in GI office in 3 months with                            Dr.Deral Schellenberg or POD C APP Gustav ALONSO Mcgee, MD 03/04/2024 10:54:30 AM This report has been signed electronically.

## 2024-03-04 NOTE — Op Note (Signed)
 [facility] Patient Name: Maria Andrews Procedure Date: 03/04/2024 10:01 AM MRN: [id number] Endoscopist: Gustav ALONSO Mcgee , MD, [id number] Age: 73 Referring MD:  Date of Birth: 08-Nov-1950 Gender: Female Account #: [id number] Procedure:                Colonoscopy Indications:              Unexplained iron deficiency anemia Medicines:                Monitored Anesthesia Care Procedure:                Pre-Anesthesia Assessment:                           - Prior to the procedure, a History and Physical                            was performed, and patient medications and                            allergies were reviewed. The patient's tolerance of                            previous anesthesia was also reviewed. The risks                            and benefits of the procedure and the sedation                            options and risks were discussed with the patient.                            All questions were answered, and informed consent                            was obtained. Prior Anticoagulants: The patient has                            taken no anticoagulant or antiplatelet agents. ASA                            Grade Assessment: II - A patient with mild systemic                            disease. After reviewing the risks and benefits,                            the patient was deemed in satisfactory condition to                            undergo the procedure.                           After obtaining informed consent, the colonoscope  was passed under direct vision. Throughout the                            procedure, the patient's blood pressure, pulse, and                            oxygen saturations were monitored continuously. The                            [id number] Colonoscope [id number] was introduced                            through the anus and advanced to the the cecum,                            identified by  appendiceal orifice and ileocecal                            valve. The colonoscopy was performed without                            difficulty. The patient tolerated the procedure                            well. The quality of the bowel preparation was                            good. The ileocecal valve, appendiceal orifice, and                            rectum were photographed. Scope In: 10:23:47 AM Scope Out: 10:37:22 AM Scope Withdrawal Time: 0 hours 9 minutes 34 seconds  Total Procedure Duration: 0 hours 13 minutes 35 seconds  Findings:                 The perianal and digital rectal examinations were                            normal.                           A 4 mm polyp was found in the sigmoid colon. The                            polyp was sessile. The polyp was removed with a                            cold snare. Resection and retrieval were complete.                           Non-bleeding external and internal hemorrhoids were                            found during retroflexion. The hemorrhoids were  medium-sized. Complications:            No immediate complications. Estimated Blood Loss:     Estimated blood loss was minimal. Impression:               - One 4 mm polyp in the sigmoid colon, removed with                            a cold snare. Resected and retrieved.                           - Non-bleeding external and internal hemorrhoids. Recommendation:           - Resume previous diet.                           - Continue present medications.                           - Await pathology results.                           - No repeat colonoscopy due to age. Waylyn Tenbrink V. Hiroko Tregre, MD 03/04/2024 10:46:06 AM This report has been signed electronically.

## 2024-03-07 ENCOUNTER — Telehealth: Payer: Self-pay | Admitting: *Deleted

## 2024-03-07 NOTE — Telephone Encounter (Signed)
  Follow up Call-     03/04/2024    9:14 AM  Call back number  Post procedure Call Back phone  # [phone]  Permission to leave phone message Yes     Patient questions:  Do you have a fever, pain , or abdominal swelling? No. Pain Score  0 *  Have you tolerated food without any problems? Yes.    Have you been able to return to your normal activities? Yes.    Do you have any questions about your discharge instructions: Diet   No. Medications  No. Follow up visit  No.  Do you have questions or concerns about your Care? No.  Actions: * If pain score is 4 or above: No action needed, pain <4.

## 2024-03-08 LAB — SURGICAL PATHOLOGY

## 2024-03-09 ENCOUNTER — Other Ambulatory Visit: Payer: Self-pay

## 2024-03-09 ENCOUNTER — Encounter: Payer: Self-pay | Admitting: Gastroenterology

## 2024-03-09 ENCOUNTER — Telehealth: Payer: Self-pay

## 2024-03-09 DIAGNOSIS — D509 Iron deficiency anemia, unspecified: Secondary | ICD-10-CM | POA: Insufficient documentation

## 2024-03-09 DIAGNOSIS — D500 Iron deficiency anemia secondary to blood loss (chronic): Secondary | ICD-10-CM

## 2024-03-09 HISTORY — DX: Iron deficiency anemia, unspecified: D50.9

## 2024-03-09 NOTE — Telephone Encounter (Signed)
 Good morning, I am doing Dr. Trenna procedure reports and in her recommendations she is recommending this patient start IV Iron infusions (Venofer).

## 2024-03-14 ENCOUNTER — Telehealth: Payer: Self-pay

## 2024-03-14 NOTE — Telephone Encounter (Addendum)
 Dr. Nandigam, patient will be scheduled as soon as possible.  Auth Submission: NO AUTH NEEDED Site of care: Site of care: [facility] Payer: [employer] medicare Medication & CPT/J Code(s) submitted: Venofer (Iron Sucrose) J1756 Diagnosis Code:  Route of submission (phone, fax, portal):  Phone # Fax # Auth type: Buy/Bill PB Units/visits requested: 200mg  x 5 doses Reference number:  Approval from: 03/14/24 to 04/13/25

## 2024-03-15 ENCOUNTER — Ambulatory Visit: Payer: Self-pay | Admitting: Gastroenterology
